# Patient Record
Sex: Female | Born: 1935 | Race: Black or African American | Hispanic: No | Marital: Single | Smoking: Former smoker
Health system: Southern US, Community
[De-identification: ages and names within clinical notes are randomized; demographics above are authoritative.]

## PROBLEM LIST (undated history)

## (undated) DIAGNOSIS — I714 Abdominal aortic aneurysm, without rupture, unspecified: Secondary | ICD-10-CM

## (undated) DIAGNOSIS — I340 Nonrheumatic mitral (valve) insufficiency: Secondary | ICD-10-CM

## (undated) DIAGNOSIS — I10 Essential (primary) hypertension: Secondary | ICD-10-CM

## (undated) DIAGNOSIS — E7800 Pure hypercholesterolemia, unspecified: Secondary | ICD-10-CM

## (undated) DIAGNOSIS — J449 Chronic obstructive pulmonary disease, unspecified: Secondary | ICD-10-CM

## (undated) DIAGNOSIS — Z86718 Personal history of other venous thrombosis and embolism: Secondary | ICD-10-CM

## (undated) DIAGNOSIS — K219 Gastro-esophageal reflux disease without esophagitis: Secondary | ICD-10-CM

## (undated) DIAGNOSIS — IMO0001 Reserved for inherently not codable concepts without codable children: Secondary | ICD-10-CM

## (undated) HISTORY — PX: OVARIAN CYST REMOVAL: SHX89

## (undated) HISTORY — PX: BREAST LUMPECTOMY: SHX2

## (undated) HISTORY — PX: FEMORAL ARTERY STENT: SHX1583

## (undated) HISTORY — PX: TOE AMPUTATION: SHX809

## (undated) HISTORY — PX: CYST EXCISION: SHX5701

## (undated) HISTORY — PX: TONSILLECTOMY: SUR1361

## (undated) HISTORY — PX: OTHER SURGICAL HISTORY: SHX169

---

## 2004-06-15 ENCOUNTER — Ambulatory Visit: Payer: Self-pay | Admitting: *Deleted

## 2004-06-17 ENCOUNTER — Ambulatory Visit: Payer: Self-pay | Admitting: *Deleted

## 2004-07-29 ENCOUNTER — Observation Stay: Payer: Self-pay | Admitting: *Deleted

## 2006-02-07 ENCOUNTER — Ambulatory Visit: Payer: Self-pay | Admitting: Unknown Physician Specialty

## 2006-03-09 ENCOUNTER — Ambulatory Visit: Payer: Self-pay | Admitting: Unknown Physician Specialty

## 2006-05-11 ENCOUNTER — Emergency Department: Payer: Self-pay | Admitting: Emergency Medicine

## 2006-05-16 ENCOUNTER — Ambulatory Visit: Payer: Self-pay | Admitting: Family Medicine

## 2009-04-18 ENCOUNTER — Ambulatory Visit: Payer: Self-pay | Admitting: Vascular Surgery

## 2009-09-30 ENCOUNTER — Ambulatory Visit: Payer: Self-pay | Admitting: Family Medicine

## 2010-07-21 ENCOUNTER — Ambulatory Visit: Payer: Self-pay | Admitting: Vascular Surgery

## 2011-05-20 ENCOUNTER — Ambulatory Visit: Payer: Self-pay | Admitting: Vascular Surgery

## 2011-05-20 LAB — BASIC METABOLIC PANEL
BUN: 10 mg/dL (ref 7–18)
Calcium, Total: 9.1 mg/dL (ref 8.5–10.1)
Co2: 28 mmol/L (ref 21–32)
Creatinine: 0.72 mg/dL (ref 0.60–1.30)
EGFR (Non-African Amer.): >60
Glucose: 84 mg/dL (ref 65–99)
Potassium: 3.2 mmol/L — ABNORMAL LOW (ref 3.5–5.1)
Sodium: 141 mmol/L (ref 136–145)

## 2011-05-21 ENCOUNTER — Emergency Department: Payer: Self-pay | Admitting: Emergency Medicine

## 2011-06-23 ENCOUNTER — Ambulatory Visit: Payer: Self-pay | Admitting: Podiatry

## 2011-06-23 LAB — BASIC METABOLIC PANEL
Anion Gap: 6 — ABNORMAL LOW (ref 7–16)
BUN: 15 mg/dL (ref 7–18)
Calcium, Total: 9.4 mg/dL (ref 8.5–10.1)
Chloride: 109 mmol/L — ABNORMAL HIGH (ref 98–107)
Creatinine: 0.87 mg/dL (ref 0.60–1.30)
EGFR (African American): >60
Glucose: 99 mg/dL (ref 65–99)
Osmolality: 286 (ref 275–301)
Sodium: 143 mmol/L (ref 136–145)

## 2011-06-23 LAB — HEMOGLOBIN: HGB: 11.5 g/dL — ABNORMAL LOW (ref 12.0–16.0)

## 2011-06-25 ENCOUNTER — Ambulatory Visit: Payer: Self-pay | Admitting: Podiatry

## 2011-06-28 LAB — PATHOLOGY REPORT

## 2011-07-03 LAB — WOUND CULTURE

## 2011-10-12 ENCOUNTER — Ambulatory Visit: Payer: Self-pay | Admitting: Vascular Surgery

## 2011-10-12 LAB — BASIC METABOLIC PANEL
Anion Gap: 8 (ref 7–16)
Calcium, Total: 9.6 mg/dL (ref 8.5–10.1)
Chloride: 110 mmol/L — ABNORMAL HIGH (ref 98–107)
Co2: 25 mmol/L (ref 21–32)
Creatinine: 0.8 mg/dL (ref 0.60–1.30)
Sodium: 143 mmol/L (ref 136–145)

## 2013-01-08 ENCOUNTER — Ambulatory Visit: Payer: Self-pay | Admitting: Vascular Surgery

## 2013-01-08 LAB — CREATININE, SERUM
Creatinine: 0.59 mg/dL — ABNORMAL LOW (ref 0.60–1.30)
EGFR (African American): >60
EGFR (Non-African Amer.): >60

## 2014-06-14 NOTE — Op Note (Signed)
PATIENT NAME:  Gabrielle Lyons, Gabrielle Lyons MR#:  [id number] DATE OF BIRTH:  [birth year]  DATE OF PROCEDURE:  01/08/2013  PREOPERATIVE DIAGNOSES:  1.  Peripheral arterial disease with claudication and possible ischemic rest pain, right foot.  2.  Peripheral arterial disease with claudication, left lower extremity.  3.  Status post previous intervention.  4.  Hypertension.  5.  Tobacco dependence.   POSTOPERATIVE DIAGNOSES: 1.  Peripheral arterial disease with claudication and possible ischemic rest pain, right foot.  2.  Peripheral arterial disease with claudication, left lower extremity.  3.  Status post previous intervention.  4.  Hypertension.  5.  Tobacco dependence.   PROCEDURE:  1.  Ultrasound guidance for vascular access to left femoral artery.  2.  Catheter placement to right peroneal artery from left femoral approach.  3.  Aortogram and selective right lower extremity angiogram.  4.  Percutaneous transluminal angioplasty of right tibioperoneal trunk and peroneal artery with 3 mm diameter angioplasty balloon.  5.  StarClose closure device, left femoral artery.   SURGEON: Annice NeedyJason S Dew, M.D.   ANESTHESIA: Local with moderate conscious sedation.   ESTIMATED BLOOD LOSS: Approximately 25 mL.   INDICATION FOR PROCEDURE: This is an individual with severe peripheral vascular, disease we have treated for some time. She has had previous right lower extremity intervention, which improved her pain in the right lower extremity. She now has pain with very minimal activity consistent with short distance claudication and some pain even at night, which may be early ischemic rest pain. For these reasons, she is brought in for angiography. A noninvasive study has shown that the flow through her stent is somewhat sluggish and with distal flow being significantly reduced. She desires to proceed with intervention. The risks and benefits were discussed. Informed consent was obtained.   DESCRIPTION OF PROCEDURE:  The patient is brought to the vascular interventional radiology suite. Groins were shaved and prepped and a sterile surgical field was created. The left femoral head was localized with fluoroscopy. Ultrasound was used to visualize a patent left femoral artery. It was then accessed under direct ultrasound guidance without difficulty with a  Seldinger needle. A J-wire and 5-French sheath were placed. A pigtail catheter was placed into the aorta at the L1-L2 level. AP aortogram was performed. This showed some aneurysmal degeneration of the aorta and iliac vessels without flow-limiting stenosis. I then hooked the aortic bifurcation and advanced to the right femoral head and selective right lower extremity angiogram was then performed. This demonstrated her proximal SFA stent was patent. Her mid to distal SFA stents that were continuous with her stent down to the below-knee popliteal artery were all patent. There were several areas with less than 30% stenosis within the stents however, just below the stent in the below-knee popliteal artery location in the area of the tibioperoneal trunk in her distal popliteal artery, the artery occluded. Her runoff was very poor distally and was mostly through collaterals. The patient was systemically heparinized.   A 6-French Ansell sheath was placed over a Terumo advantage wire. I then used initially a Kumpe catheter, then a CXI catheter and Terumo advantage wire and exchanged for an 0.018 wire. I was able to cross the tibioperoneal trunk and peroneal occlusion and park a wire in the peroneal artery at the ankle. A 2.5 mm diameter angioplasty balloon was inflated from the distal peroneal artery all the way up to the popliteal artery encompassing the areas of occlusions. Following angioplasty, there was markedly improved flow  in the peroneal artery. There was an AV fistula distally, but this did not appear to flow into the foot pain and her distal perfusion was significantly  improved. At this point, I elected to terminate the procedure. The sheath was pulled back to the ipsilateral external iliac artery and oblique arteriogram was performed. StarClose closure device was deployed in the usual fashion with excellent hemostatic result. The patient tolerated the procedure well and was taken to the recovery room in stable condition.   ____________________________ Annice Needy, MD jsd:aw D: 01/08/2013 11:20:45 ET T: 01/08/2013 12:02:27 ET JOB#: [id number]  cc: Annice Needy, MD, <Dictator> Annice Needy MD ELECTRONICALLY SIGNED 01/10/2013 8:42

## 2014-06-16 NOTE — Op Note (Signed)
PATIENT NAME:  Gabrielle Lyons, Gabrielle Lyons MR#:  [id number] DATE OF BIRTH:  [birth year]  DATE OF PROCEDURE:  06/25/2011  PREOPERATIVE DIAGNOSIS: Right third toe gangrene with osteomyelitis.   POSTOPERATIVE DIAGNOSIS: Right third toe gangrene with osteomyelitis.   PROCEDURE: Amputation of metatarsophalangeal joint right third toe.   SURGEON: Refoel Palladino A. Ether GriffinsFowler, DPM.   ANESTHESIA: MAC with local.   HEMOSTASIS: None.  COMPLICATIONS: None.  SPECIMEN: Gangrenous third toe for culture as well as pathology.   OPERATIVE INDICATIONS: This is a 81 year old female who has developed osteomyelitis with gangrene of her right third toe. We have treated her conservatively. She had been evaluated by vascular surgery with lower extremity stenting. At this time, we have discussed surgical intervention and consent has been given.   DESCRIPTION OF PROCEDURE: The patient was brought into the Operating Room and placed on the operating table in the supine position. IV sedation was administered by the anesthesia team. A local block was placed around the third metatarsophalangeal joint. After sterile prep and drape, attention was directed to the third metatarsophalangeal joint where two semi-elliptical incisions were made full thickness down to bone. The toe was then disarticulated sharply and removed from the surgical field in toto. Mild amount of bleeding was noted in the wound. There was no purulence. A wound culture was then taken of the deeper wound area. At this time, the wound was flushed with copious amounts of irrigation and closed with 4-0 Vicryl for the deeper layer and a 5-0 nylon for skin. A 0.5% Marcaine block was placed around all areas and a well-compressive sterile dressing was placed on the right foot. Overall, the patient tolerated the procedure and anesthesia well and was transported from the Operating Room to the     PACU with all vital signs stable and neurovascular status intact. I will see her in the  outpatient clinic in 5 to 7 days.   ____________________________ Argentina DonovanJustin A. Ether GriffinsFowler, DPM jaf:ap D: 06/25/2011 10:13:38 ET T: 06/25/2011 11:25:37 ET JOB#: [id number]  cc: Jill AlexandersJustin A. Ether GriffinsFowler, DPM, <Dictator> Kinley Ferrentino DPM ELECTRONICALLY SIGNED 07/02/2011 10:43

## 2014-11-12 ENCOUNTER — Encounter: Payer: Self-pay | Admitting: *Deleted

## 2014-11-15 NOTE — Discharge Instructions (Signed)

## 2014-11-18 ENCOUNTER — Ambulatory Visit: Payer: Medicare Other | Admitting: Student in an Organized Health Care Education/Training Program

## 2014-11-18 ENCOUNTER — Ambulatory Visit
Admission: RE | Admit: 2014-11-18 | Discharge: 2014-11-18 | Disposition: A | Discharge status: Home / Self Care | Payer: Medicare Other | Source: Ambulatory Visit | Attending: Ophthalmology | Admitting: Ophthalmology

## 2014-11-18 ENCOUNTER — Encounter
Admission: RE | Disposition: A | Discharge status: Home / Self Care | Payer: Self-pay | Source: Ambulatory Visit | Attending: Ophthalmology

## 2014-11-18 ENCOUNTER — Encounter: Payer: Self-pay | Admitting: *Deleted

## 2014-11-18 DIAGNOSIS — Z7982 Long term (current) use of aspirin: Secondary | ICD-10-CM | POA: Insufficient documentation

## 2014-11-18 DIAGNOSIS — Z87891 Personal history of nicotine dependence: Secondary | ICD-10-CM | POA: Insufficient documentation

## 2014-11-18 DIAGNOSIS — J449 Chronic obstructive pulmonary disease, unspecified: Secondary | ICD-10-CM | POA: Diagnosis not present

## 2014-11-18 DIAGNOSIS — Z882 Allergy status to sulfonamides status: Secondary | ICD-10-CM | POA: Insufficient documentation

## 2014-11-18 DIAGNOSIS — I739 Peripheral vascular disease, unspecified: Secondary | ICD-10-CM | POA: Insufficient documentation

## 2014-11-18 DIAGNOSIS — I10 Essential (primary) hypertension: Secondary | ICD-10-CM | POA: Insufficient documentation

## 2014-11-18 DIAGNOSIS — H2512 Age-related nuclear cataract, left eye: Secondary | ICD-10-CM | POA: Insufficient documentation

## 2014-11-18 DIAGNOSIS — K219 Gastro-esophageal reflux disease without esophagitis: Secondary | ICD-10-CM | POA: Insufficient documentation

## 2014-11-18 HISTORY — DX: Gastro-esophageal reflux disease without esophagitis: K21.9

## 2014-11-18 HISTORY — PX: CATARACT EXTRACTION W/PHACO: SHX586

## 2014-11-18 HISTORY — DX: Chronic obstructive pulmonary disease, unspecified: J44.9

## 2014-11-18 HISTORY — DX: Nonrheumatic mitral (valve) insufficiency: I34.0

## 2014-11-18 HISTORY — DX: Essential (primary) hypertension: I10

## 2014-11-18 HISTORY — DX: Personal history of other venous thrombosis and embolism: Z86.718

## 2014-11-18 HISTORY — DX: Pure hypercholesterolemia, unspecified: E78.00

## 2014-11-18 HISTORY — DX: Abdominal aortic aneurysm, without rupture, unspecified: I71.40

## 2014-11-18 HISTORY — DX: Reserved for inherently not codable concepts without codable children: IMO0001

## 2014-11-18 HISTORY — DX: Abdominal aortic aneurysm, without rupture: I71.4

## 2014-11-18 SURGERY — PHACOEMULSIFICATION, CATARACT, WITH IOL INSERTION
Anesthesia: Monitor Anesthesia Care | Laterality: Left | Wound class: Clean

## 2014-11-18 MED ORDER — PROPARACAINE HCL 0.5 % OP SOLN
1.0000 [drp] | Freq: Once | OPHTHALMIC | Status: AC
  Administered 2014-11-18: 1 [drp] via OPHTHALMIC

## 2014-11-18 MED ORDER — ARMC OPHTHALMIC DILATING GEL
1.0000 "application " | OPHTHALMIC | Status: DC | PRN
  Administered 2014-11-18 (×2): 1 via OPHTHALMIC

## 2014-11-18 MED ORDER — NA HYALUR & NA CHOND-NA HYALUR 0.55-0.5 ML IO KIT
PACK | INTRAOCULAR | Status: DC | PRN
  Administered 2014-11-18: 1 via INTRAOCULAR

## 2014-11-18 MED ORDER — LIDOCAINE HCL (PF) 4 % IJ SOLN
INTRAMUSCULAR | Status: DC | PRN
  Administered 2014-11-18: 1 mL via OPHTHALMIC

## 2014-11-18 MED ORDER — TIMOLOL MALEATE 0.5 % OP SOLN
OPHTHALMIC | Status: DC | PRN
  Administered 2014-11-18: 1 [drp]

## 2014-11-18 MED ORDER — CEFUROXIME OPHTHALMIC INJECTION 1 MG/0.1 ML
INJECTION | OPHTHALMIC | Status: DC | PRN
  Administered 2014-11-18: 0.1 mL via INTRACAMERAL

## 2014-11-18 MED ORDER — BRIMONIDINE TARTRATE 0.2 % OP SOLN
OPHTHALMIC | Status: DC | PRN
  Administered 2014-11-18: 1 [drp]

## 2014-11-18 MED ORDER — POVIDONE-IODINE 5 % OP SOLN
1.0000 "application " | OPHTHALMIC | Status: DC | PRN
  Administered 2014-11-18: 1 via OPHTHALMIC

## 2014-11-18 MED ORDER — EPINEPHRINE HCL 1 MG/ML IJ SOLN
INTRAOCULAR | Status: DC | PRN
  Administered 2014-11-18: 114 mL via OPHTHALMIC

## 2014-11-18 MED ORDER — LACTATED RINGERS IV SOLN: INTRAVENOUS | Status: DC

## 2014-11-18 SURGICAL SUPPLY — 29 items
APPLICATOR COTTON TIP 3IN (MISCELLANEOUS) ×3 IMPLANT
CANNULA ANT/CHMB 27GA (MISCELLANEOUS) ×3 IMPLANT
DISSECTOR HYDRO NUCLEUS 50X22 (MISCELLANEOUS) ×3 IMPLANT
GLOVE BIO SURGEON STRL SZ7 (GLOVE) ×3 IMPLANT
GLOVE SURG LX 6.5 MICRO (GLOVE) ×2
GLOVE SURG LX STRL 6.5 MICRO (GLOVE) ×1 IMPLANT
GOWN STRL REUS W/ TWL LRG LVL3 (GOWN DISPOSABLE) ×2 IMPLANT
GOWN STRL REUS W/TWL LRG LVL3 (GOWN DISPOSABLE) ×4
LENS IOL ACRSF IQ PC 20.0 (Intraocular Lens) ×1 IMPLANT
LENS IOL ACRYSOF IQ POST 20.0 (Intraocular Lens) ×3 IMPLANT
MARKER SKIN SURG W/RULER VIO (MISCELLANEOUS) ×3 IMPLANT
NEEDLE FILTER BLUNT 18X 1/2SAF (NEEDLE) ×2
NEEDLE FILTER BLUNT 18X1 1/2 (NEEDLE) ×1 IMPLANT
PACK CATARACT BRASINGTON (MISCELLANEOUS) ×3 IMPLANT
PACK EYE AFTER SURG (MISCELLANEOUS) ×3 IMPLANT
PACK OPTHALMIC (MISCELLANEOUS) ×3 IMPLANT
RING MALYGIN 7.0 (MISCELLANEOUS) IMPLANT
SOL BAL SALT 15ML (MISCELLANEOUS)
SOLUTION BAL SALT 15ML (MISCELLANEOUS) IMPLANT
SUT ETHILON 10-0 CS-B-6CS-B-6 (SUTURE)
SUT VICRYL  9 0 (SUTURE)
SUT VICRYL 9 0 (SUTURE) IMPLANT
SUTURE EHLN 10-0 CS-B-6CS-B-6 (SUTURE) IMPLANT
SYR 3ML LL SCALE MARK (SYRINGE) ×3 IMPLANT
SYR TB 1ML LUER SLIP (SYRINGE) ×6 IMPLANT
WATER STERILE IRR 250ML POUR (IV SOLUTION) ×3 IMPLANT
WATER STERILE IRR 500ML POUR (IV SOLUTION) IMPLANT
WICK EYE OCUCEL (MISCELLANEOUS) IMPLANT
WIPE NON LINTING 3.25X3.25 (MISCELLANEOUS) ×3 IMPLANT

## 2014-11-18 NOTE — Anesthesia Postprocedure Evaluation (Signed)
  Anesthesia Post-op Note  Patient: Gabrielle Lyons  Procedure(s) Performed: Procedure(s): CATARACT EXTRACTION PHACO AND INTRAOCULAR LENS PLACEMENT (IOC) (Left)  Anesthesia type:MAC  Patient location: PACU  Post pain: Pain level controlled  Post assessment: Post-op Vital signs reviewed, Patient's Cardiovascular Status Stable, Respiratory Function Stable, Patent Airway and No signs of Nausea or vomiting  Post vital signs: Reviewed and stable  Last Vitals:  Filed Vitals:   11/18/14 0915  BP: 134/92  Pulse: 49  Temp:   Resp: 15    Level of consciousness: awake, alert  and patient cooperative  Complications: No apparent anesthesia complications

## 2014-11-18 NOTE — Anesthesia Preprocedure Evaluation (Addendum)
Anesthesia Evaluation  Patient identified by MRN, date of birth, ID band  Airway Mallampati: II  TM Distance: >3 FB Neck ROM: Full    Dental   Pulmonary COPD, former smoker,           Cardiovascular hypertension, + Peripheral Vascular Disease       Neuro/Psych    GI/Hepatic GERD  ,  Endo/Other    Renal/GU      Musculoskeletal   Abdominal   Peds  Hematology   Anesthesia Other Findings   Reproductive/Obstetrics                            Anesthesia Physical Anesthesia Plan  ASA: III  Anesthesia Plan: MAC   Post-op Pain Management:    Induction: Intravenous  Airway Management Planned:   Additional Equipment:   Intra-op Plan:   Post-operative Plan:   Informed Consent: I have reviewed the patients History and Physical, chart, labs and discussed the procedure including the risks, benefits and alternatives for the proposed anesthesia with the patient or authorized representative who has indicated his/her understanding and acceptance.     Plan Discussed with: CRNA  Anesthesia Plan Comments:         Anesthesia Quick Evaluation

## 2014-11-18 NOTE — H&P (Signed)
H+P reviewed and is up to date, please see paper chart.  

## 2014-11-18 NOTE — Anesthesia Procedure Notes (Signed)
Procedure Name: MAC Performed by: BUSH, WENDY Pre-anesthesia Checklist: Patient identified, Emergency Drugs available, Suction available, Timeout performed and Patient being monitored Patient Re-evaluated:Patient Re-evaluated prior to inductionOxygen Delivery Method: Nasal cannula Placement Confirmation: positive ETCO2     

## 2014-11-18 NOTE — Op Note (Signed)
Date of Surgery: 11/18/2014  PREOPERATIVE DIAGNOSES: Visually significant nuclear sclerotic cataract, left eye.  POSTOPERATIVE DIAGNOSES: Same  PROCEDURES PERFORMED: Cataract extraction with intraocular lens implant, left eye.  SURGEON: Devin Going, M.D.  ANESTHESIA: MAC and topical  IMPLANTS: AcrySof IQ [id number] +20.0 D   Implant Name Type Inv. Item Serial No. Manufacturer Lot No. LRB No. Used  IMPLANT LENS - [id number] Intraocular Lens IMPLANT LENS [id number] ALCON   Left 1    COMPLICATIONS: None.  DESCRIPTION OF PROCEDURE: Therapeutic options were discussed with the patient preoperatively, including a discussion of risks and benefits of surgery. Informed consent was obtained. An IOL-Master and immersion biometry were used to take the lens measurements, and a dilated fundus exam was performed within 6 months of the surgical date.  The patient was premedicated and brought to the operating room and placed on the operating table in the supine position. After adequate anesthesia, the patient was prepped and draped in the usual sterile ophthalmic fashion. A wire lid speculum was inserted and the microscope was positioned. A Superblade was used to create a paracentesis site at the limbus and a small amount of dilute preservative free lidocaine was instilled into the anterior chamber, followed by dispersive viscoelastic. A clear corneal incision was created temporally using a 2.4 mm keratome blade. Capsulorrhexis was then performed. In situ phacoemulsification was performed.  Cortical material was removed with the irrigation-aspiration unit. Dispersive viscoelastic was instilled to open the capsular bag. A posterior chamber intraocular lens with the specifications above was inserted and positioned. Irrigation-aspiration was used to remove all viscoelastic. Cefuroxime 1cc was into the anterior chamber, and the corneal incision was checked and found to be water tight. The eyelid speculum was  removed.  The operative eye was covered with protective goggles after instilling 1 drop of timolol and brimonidine. The patient tolerated the procedure well. There were no complications.

## 2014-11-18 NOTE — Transfer of Care (Signed)
Immediate Anesthesia Transfer of Care Note  Patient: Gabrielle Lyons  Procedure(s) Performed: Procedure(s): CATARACT EXTRACTION PHACO AND INTRAOCULAR LENS PLACEMENT (IOC) (Left)  Patient Location: PACU  Anesthesia Type: MAC  Level of Consciousness: awake, alert  and patient cooperative  Airway and Oxygen Therapy: Patient Spontanous Breathing and Patient connected to supplemental oxygen  Post-op Assessment: Post-op Vital signs reviewed, Patient's Cardiovascular Status Stable, Respiratory Function Stable, Patent Airway and No signs of Nausea or vomiting  Post-op Vital Signs: Reviewed and stable  Complications: No apparent anesthesia complications

## 2015-01-11 ENCOUNTER — Emergency Department
Admission: EM | Admit: 2015-01-11 | Discharge: 2015-01-11 | Disposition: A | Discharge status: Home / Self Care | Payer: Medicare Other | Attending: Emergency Medicine | Admitting: Emergency Medicine

## 2015-01-11 ENCOUNTER — Encounter: Payer: Self-pay | Admitting: Emergency Medicine

## 2015-01-11 ENCOUNTER — Emergency Department: Payer: Medicare Other

## 2015-01-11 DIAGNOSIS — S199XXA Unspecified injury of neck, initial encounter: Secondary | ICD-10-CM | POA: Diagnosis not present

## 2015-01-11 DIAGNOSIS — W01198A Fall on same level from slipping, tripping and stumbling with subsequent striking against other object, initial encounter: Secondary | ICD-10-CM | POA: Diagnosis not present

## 2015-01-11 DIAGNOSIS — Z87891 Personal history of nicotine dependence: Secondary | ICD-10-CM | POA: Insufficient documentation

## 2015-01-11 DIAGNOSIS — Z791 Long term (current) use of non-steroidal anti-inflammatories (NSAID): Secondary | ICD-10-CM | POA: Insufficient documentation

## 2015-01-11 DIAGNOSIS — Y9289 Other specified places as the place of occurrence of the external cause: Secondary | ICD-10-CM | POA: Diagnosis not present

## 2015-01-11 DIAGNOSIS — Z7982 Long term (current) use of aspirin: Secondary | ICD-10-CM | POA: Diagnosis not present

## 2015-01-11 DIAGNOSIS — I10 Essential (primary) hypertension: Secondary | ICD-10-CM | POA: Insufficient documentation

## 2015-01-11 DIAGNOSIS — Y998 Other external cause status: Secondary | ICD-10-CM | POA: Diagnosis not present

## 2015-01-11 DIAGNOSIS — Y9389 Activity, other specified: Secondary | ICD-10-CM | POA: Insufficient documentation

## 2015-01-11 DIAGNOSIS — S0990XA Unspecified injury of head, initial encounter: Secondary | ICD-10-CM

## 2015-01-11 DIAGNOSIS — Z79899 Other long term (current) drug therapy: Secondary | ICD-10-CM | POA: Diagnosis not present

## 2015-01-11 DIAGNOSIS — R42 Dizziness and giddiness: Secondary | ICD-10-CM

## 2015-01-11 LAB — URINALYSIS COMPLETE WITH MICROSCOPIC (ARMC ONLY)
BACTERIA UA: NONE SEEN
Bilirubin Urine: NEGATIVE
Glucose, UA: NEGATIVE mg/dL
Ketones, ur: NEGATIVE mg/dL
Leukocytes, UA: NEGATIVE
NITRITE: NEGATIVE
PROTEIN: NEGATIVE mg/dL
SPECIFIC GRAVITY, URINE: 1.017 (ref 1.005–1.030)
pH: 6 (ref 5.0–8.0)

## 2015-01-11 LAB — BASIC METABOLIC PANEL
Anion gap: 10 (ref 5–15)
BUN: 22 mg/dL — AB (ref 6–20)
CHLORIDE: 103 mmol/L (ref 101–111)
CO2: 29 mmol/L (ref 22–32)
Calcium: 9.9 mg/dL (ref 8.9–10.3)
Creatinine, Ser: 0.89 mg/dL (ref 0.44–1.00)
GFR calc Af Amer: >60 mL/min (ref >60)
GFR calc non Af Amer: >60 mL/min (ref >60)
Glucose, Bld: 92 mg/dL (ref 65–99)
POTASSIUM: 3.5 mmol/L (ref 3.5–5.1)
SODIUM: 142 mmol/L (ref 135–145)

## 2015-01-11 LAB — CBC
HEMATOCRIT: 36.1 % (ref 35.0–47.0)
HEMOGLOBIN: 11.9 g/dL — AB (ref 12.0–16.0)
MCH: 28.7 pg (ref 26.0–34.0)
MCHC: 33 g/dL (ref 32.0–36.0)
MCV: 86.8 fL (ref 80.0–100.0)
Platelets: 157 10*3/uL (ref 150–440)
RBC: 4.17 MIL/uL (ref 3.80–5.20)
RDW: 15.5 % — ABNORMAL HIGH (ref 11.5–14.5)
WBC: 8 10*3/uL (ref 3.6–11.0)

## 2015-01-11 NOTE — ED Notes (Signed)
Pt to ed with c/o dizziness since hitting head after fall on Monday.  Pt alert and oriented at triage.

## 2015-01-11 NOTE — ED Notes (Signed)
Patient states that she fell on Monday not exactly sure how but thinks she may have tripped. Patient denies LOC. Patient reports increased dizziness since the fall, denies N/V and headache.

## 2015-01-11 NOTE — ED Provider Notes (Signed)
[facility] Emergency Department Provider Note REMINDER - THIS NOTE IS NOT A FINAL MEDICAL RECORD UNTIL IT IS SIGNED. UNTIL THEN, THE CONTENT BELOW MAY REFLECT INFORMATION FROM A DOCUMENTATION TEMPLATE, NOT THE ACTUAL PATIENT VISIT. ____________________________________________  Time seen: Approximately 11:52 AM  I have reviewed the triage vital signs and the nursing notes.   HISTORY  Chief Complaint Head Injury    HPI Gabrielle Lyons is a 81 y.o. female presents for evaluation of some lightheadedness after a fall on Monday.  Patient and the family reports that Monday she was standing and she lost her balance, which is not unusual but she fell backwards and hit her head on the floor. There was no loss of consciousness, and she got up afterwards without injury but since that time she has been feeling slightly lightheaded at times. No nausea or vomiting, no headache. No pain in her eyes, she reports that her upper neck feels a little bit achy and she is tender across the back of the head. No chest pain or trouble breathing or other concerns.  Describes a mild achiness across the back of the scalp. She has a history of an aneurysm, does not have any abdominal discomfort or pain. No back pain.   Past Medical History  Diagnosis Date  . Hypertension   . Mitral valve insufficiency   . COPD (chronic obstructive pulmonary disease) (HCC)   . Shortness of breath dyspnea     with exertion  . GERD (gastroesophageal reflux disease)   . Hypercholesteremia   . Abdominal aneurysm (HCC)     also, "poor circulation" - legs  . H/O blood clots     legs    There are no active problems to display for this patient.   Past Surgical History  Procedure Laterality Date  . Toe amputation Right   . Femoral artery stent Right   . Tonsillectomy    . Ovarian cyst removal    . Breast lumpectomy Left   . Cyst excision      back  . Cataract extraction w/phaco Left 11/18/2014   Procedure: CATARACT EXTRACTION PHACO AND INTRAOCULAR LENS PLACEMENT (IOC);  Surgeon: Sherald HessAnita Prakash Vin-Parikh, MD;  [facility];  Service: Ophthalmology;  Laterality: Left;    Current Outpatient Rx  Name  Route  Sig  Dispense  Refill  . amLODipine (NORVASC) 10 MG tablet   Oral   Take 10 mg by mouth daily. AM         . aspirin 81 MG tablet   Oral   Take 81 mg by mouth daily. AM         . calcium carbonate (TUMS - DOSED IN MG ELEMENTAL CALCIUM) 500 MG chewable tablet   Oral   Chew 1 tablet by mouth daily as needed for indigestion or heartburn.         . hydrochlorothiazide (HYDRODIURIL) 25 MG tablet   Oral   Take 25 mg by mouth daily. PM         . lisinopril (PRINIVIL,ZESTRIL) 10 MG tablet   Oral   Take 10 mg by mouth 2 (two) times daily.         . naproxen sodium (ANAPROX) 220 MG tablet   Oral   Take 220 mg by mouth daily.         . pravastatin (PRAVACHOL) 10 MG tablet   Oral   Take 10 mg by mouth daily. PM         .  Vitamin D, Ergocalciferol, (DRISDOL) 50000 UNITS CAPS capsule   Oral   Take 50,000 Units by mouth every 7 (seven) days.           Allergies Sulfa antibiotics  History reviewed. No pertinent family history.  Social History Social History  Substance Use Topics  . Smoking status: Former Smoker    Quit date: 10/23/2013  . Smokeless tobacco: Current User    Types: Snuff  . Alcohol Use: No    Review of Systems Constitutional: No fever/chills Eyes: No visual changes. Does report revision is been slightly different since she had her cataracts removed and she still adjusting to this but no new concerns. ENT: No sore throat. Cardiovascular: Denies chest pain. Respiratory: Denies shortness of breath. Gastrointestinal: No abdominal pain.  No nausea, no vomiting.  No diarrhea.  No constipation. Genitourinary: Negative for dysuria. Musculoskeletal: Negative for back pain. Skin: Negative for rash. Neurological: Negative  for headaches, focal weakness or numbness.  10-point ROS otherwise negative.  ____________________________________________   PHYSICAL EXAM:  VITAL SIGNS: ED Triage Vitals  Enc Vitals Group     BP 01/11/15 0933 113/80 mmHg     Pulse Rate 01/11/15 0933 100     Resp 01/11/15 0933 18     Temp 01/11/15 0933 98.2 F (36.8 C)     Temp Source 01/11/15 0933 Oral     SpO2 01/11/15 0933 96 %     Weight 01/11/15 0933 130 lb (58.968 kg)     Height 01/11/15 0933  (1.676 m)     Head Cir --      Peak Flow --      Pain Score 01/11/15 0931 3     Pain Loc --      Pain Edu? --      Excl. in GC? --    Constitutional: Alert and oriented. Well appearing and in no acute distress. Eyes: Conjunctivae are normal. PERRL. EOMI. Head: Atraumatic. Nose: No congestion/rhinnorhea. Mouth/Throat: Mucous membranes are moist.  Oropharynx non-erythematous. Neck: No stridor.  No midline cervical tenderness but she does have some mild tenderness the base of the occiput Cardiovascular: Normal rate, regular rhythm. Grossly normal heart sounds.  Good peripheral circulation. Respiratory: Normal respiratory effort.  No retractions. Lungs CTAB. Gastrointestinal: Soft and nontender. No distention. No abdominal bruits. No CVA tenderness. Musculoskeletal: No lower extremity tenderness nor edema.  No joint effusions. Neurologic:  Normal speech and language. No gross focal neurologic deficits are appreciated.  Skin:  Skin is warm, dry and intact. No rash noted. Psychiatric: Mood and affect are normal. Speech and behavior are normal.  ____________________________________________   LABS (all labs ordered are listed, but only abnormal results are displayed)  Labs Reviewed  CBC - Abnormal; Notable for the following:    Hemoglobin 11.9 (*)    RDW 15.5 (*)    All other components within normal limits  BASIC METABOLIC PANEL - Abnormal; Notable for the following:    BUN 22 (*)    All other components within normal  limits  URINALYSIS COMPLETEWITH MICROSCOPIC ([facility] ONLY) - Abnormal; Notable for the following:    Color, Urine YELLOW (*)    APPearance CLEAR (*)    Hgb urine dipstick 2+ (*)    Squamous Epithelial / LPF 0-5 (*)    All other components within normal limits   ____________________________________________  EKG  Reviewed and interpreted by me EKG time 10:45 AM Normal sinus rhythm, heart rate 60 Right bundle-branch block morphology noted with expected T  wave inversions in V1 through V3, there is a slight T-wave inversion noted in V4 dissociative RSR prime configuration likely secondary to bundle-branch block Mobile T-wave inversions also noted in 3 and aVF, no ST elevation ____________________________________________  RADIOLOGY  IMPRESSION: No acute intracranial abnormality.  Evidence for chronic small vessel ischemic changes.  Multilevel degenerative disease in the cervical spine without acute bone abnormality.  2.9 cm right thyroid nodule. Recommend a nonemergent thyroid ultrasound for further characterization. ____________________________________________   PROCEDURES  Procedure(s) performed: None  Critical Care performed: No  ____________________________________________   INITIAL IMPRESSION / ASSESSMENT AND PLAN / ED COURSE  Pertinent labs & imaging results that were available during my care of the patient were reviewed by me and considered in my medical decision making (see chart for details).  Patient presents after a fall she isn't experiencing some slight lightheadedness afterwards. She is not any notable anticoagulants, and she did not lose consciousness. Her exam and neurologic exam are very reassuring. She does endorse a slight feeling of lightheadedness for the last week. Her EKG does show a right bundle-branch block and some minimal but noted T-wave inversions in inferior distribution, however she is nontender states she chest pain there is no evidence of acute  ST elevation.  We'll check blood count given the patient's symptomatology and basic metabolic panel. Also given her age and elderly status, we will evaluate with urinalysis. These are negative, and the patient continues to only have just mild lightheadedness with intact neurologic exam I have advised discharge and follow-up with her primary care whom works at [facility]. Patient and her family are very agreeable with this plan. She is hemodynamically stable and in no distress.  ----------------------------------------- 12:14 PM on 01/11/2015 -----------------------------------------  Patient sitting up in bed, conversing with family in no distress. Denies any concerns. ____________________________________________   FINAL CLINICAL IMPRESSION(S) / ED DIAGNOSES  Final diagnoses:  Lightheadedness  Closed head injury, initial encounter   Thyroid nodule Closed head injury    Sharyn Creamer, MD 01/11/15 1243

## 2015-01-11 NOTE — Discharge Instructions (Signed)
Dizziness  In addition you have a small nodule noted on your thyroid, please make sure you follow up with your primary care doctor for further evaluation regarding this within a month.  Dizziness is a common problem. It is a feeling of unsteadiness or light-headedness. You may feel like you are about to faint. Dizziness can lead to injury if you stumble or fall. Anyone can become dizzy, but dizziness is more common in older adults. This condition can be caused by a number of things, including medicines, dehydration, or illness. HOME CARE INSTRUCTIONS Taking these steps may help with your condition: Eating and Drinking  Drink enough fluid to keep your urine clear or pale yellow. This helps to keep you from becoming dehydrated. Try to drink more clear fluids, such as water.  Do not drink alcohol.  Limit your caffeine intake if directed by your health care provider.  Limit your salt intake if directed by your health care provider. Activity  Avoid making quick movements.  Rise slowly from chairs and steady yourself until you feel okay.  In the morning, first sit up on the side of the bed. When you feel okay, stand slowly while you hold onto something until you know that your balance is fine.  Move your legs often if you need to stand in one place for a long time. Tighten and relax your muscles in your legs while you are standing.  Do not drive or operate heavy machinery if you feel dizzy.  Avoid bending down if you feel dizzy. Place items in your home so that they are easy for you to reach without leaning over. Lifestyle  Do not use any tobacco products, including cigarettes, chewing tobacco, or electronic cigarettes. If you need help quitting, ask your health care provider.  Try to reduce your stress level, such as with yoga or meditation. Talk with your health care provider if you need help. General Instructions  Watch your dizziness for any changes.  Take medicines only as  directed by your health care provider. Talk with your health care provider if you think that your dizziness is caused by a medicine that you are taking.  Tell a friend or a family member that you are feeling dizzy. If he or she notices any changes in your behavior, have this person call your health care provider.  Keep all follow-up visits as directed by your health care provider. This is important. SEEK MEDICAL CARE IF:  Your dizziness does not go away.  Your dizziness or light-headedness gets worse.  You feel nauseous.  You have reduced hearing.  You have new symptoms.  You are unsteady on your feet or you feel like the room is spinning. SEEK IMMEDIATE MEDICAL CARE IF:  You vomit or have diarrhea and are unable to eat or drink anything.  You have problems talking, walking, swallowing, or using your arms, hands, or legs.  You feel generally weak.  You are not thinking clearly or you have trouble forming sentences. It may take a friend or family member to notice this.  You have chest pain, abdominal pain, shortness of breath, or sweating.  Your vision changes.  You notice any bleeding.  You have a headache.  You have neck pain or a stiff neck.  You have a fever.   This information is not intended to replace advice given to you by your health care provider. Make sure you discuss any questions you have with your health care provider.   Document Released: 08/04/2000  Document Revised: 06/25/2014 Document Reviewed: 02/04/2014 Elsevier Interactive Patient Education [employer]

## 2015-05-09 ENCOUNTER — Other Ambulatory Visit
Admission: RE | Admit: 2015-05-09 | Discharge: 2015-05-09 | Disposition: A | Discharge status: Home / Self Care | Payer: Medicare Other | Source: Ambulatory Visit | Attending: Vascular Surgery | Admitting: Vascular Surgery

## 2015-05-09 DIAGNOSIS — I70229 Atherosclerosis of native arteries of extremities with rest pain, unspecified extremity: Secondary | ICD-10-CM | POA: Insufficient documentation

## 2015-05-09 LAB — CREATININE, SERUM
Creatinine, Ser: 1.01 mg/dL — ABNORMAL HIGH (ref 0.44–1.00)
GFR calc Af Amer: 60 mL/min — ABNORMAL LOW (ref >60)
GFR calc non Af Amer: 52 mL/min — ABNORMAL LOW (ref >60)

## 2015-05-09 LAB — BUN: BUN: 34 mg/dL — AB (ref 6–20)

## 2015-05-12 ENCOUNTER — Encounter
Admission: RE | Disposition: A | Discharge status: Home / Self Care | Payer: Self-pay | Source: Ambulatory Visit | Attending: Vascular Surgery

## 2015-05-12 ENCOUNTER — Ambulatory Visit
Admission: RE | Admit: 2015-05-12 | Discharge: 2015-05-12 | Disposition: A | Discharge status: Home / Self Care | Payer: Medicare Other | Source: Ambulatory Visit | Attending: Vascular Surgery | Admitting: Vascular Surgery

## 2015-05-12 DIAGNOSIS — F17200 Nicotine dependence, unspecified, uncomplicated: Secondary | ICD-10-CM | POA: Insufficient documentation

## 2015-05-12 DIAGNOSIS — I10 Essential (primary) hypertension: Secondary | ICD-10-CM | POA: Diagnosis not present

## 2015-05-12 DIAGNOSIS — Z7982 Long term (current) use of aspirin: Secondary | ICD-10-CM | POA: Insufficient documentation

## 2015-05-12 DIAGNOSIS — Z79899 Other long term (current) drug therapy: Secondary | ICD-10-CM | POA: Insufficient documentation

## 2015-05-12 DIAGNOSIS — Z882 Allergy status to sulfonamides status: Secondary | ICD-10-CM | POA: Insufficient documentation

## 2015-05-12 DIAGNOSIS — E785 Hyperlipidemia, unspecified: Secondary | ICD-10-CM | POA: Insufficient documentation

## 2015-05-12 DIAGNOSIS — I70221 Atherosclerosis of native arteries of extremities with rest pain, right leg: Secondary | ICD-10-CM | POA: Diagnosis not present

## 2015-05-12 DIAGNOSIS — Z791 Long term (current) use of non-steroidal anti-inflammatories (NSAID): Secondary | ICD-10-CM | POA: Insufficient documentation

## 2015-05-12 DIAGNOSIS — I739 Peripheral vascular disease, unspecified: Secondary | ICD-10-CM | POA: Insufficient documentation

## 2015-05-12 HISTORY — PX: PERIPHERAL VASCULAR CATHETERIZATION: SHX172C

## 2015-05-12 LAB — CREATININE, SERUM
CREATININE: 0.95 mg/dL (ref 0.44–1.00)
GFR, EST NON AFRICAN AMERICAN: 55 mL/min — AB (ref >60)

## 2015-05-12 LAB — BUN: BUN: 23 mg/dL — AB (ref 6–20)

## 2015-05-12 SURGERY — LOWER EXTREMITY ANGIOGRAPHY
Anesthesia: Moderate Sedation | Laterality: Right

## 2015-05-12 MED ORDER — LIDOCAINE-EPINEPHRINE (PF) 1 %-1:200000 IJ SOLN
INTRAMUSCULAR | Status: DC | PRN
  Administered 2015-05-12: 10 mL via INTRADERMAL

## 2015-05-12 MED ORDER — CLOPIDOGREL BISULFATE 75 MG PO TABS
75.0000 mg | ORAL_TABLET | Freq: Once | ORAL | Status: AC
  Administered 2015-05-12: 75 mg via ORAL

## 2015-05-12 MED ORDER — METOPROLOL TARTRATE 1 MG/ML IV SOLN: 2.0000 mg | INTRAVENOUS | Status: DC | PRN

## 2015-05-12 MED ORDER — HEPARIN SODIUM (PORCINE) 1000 UNIT/ML IJ SOLN
INTRAMUSCULAR | Status: AC
  Filled 2015-05-12: qty 1

## 2015-05-12 MED ORDER — HYDRALAZINE HCL 20 MG/ML IJ SOLN: 5.0000 mg | INTRAMUSCULAR | Status: DC | PRN

## 2015-05-12 MED ORDER — HEPARIN SODIUM (PORCINE) 1000 UNIT/ML IJ SOLN
INTRAMUSCULAR | Status: DC | PRN
  Administered 2015-05-12: 5000 [IU] via INTRAVENOUS

## 2015-05-12 MED ORDER — FENTANYL CITRATE (PF) 100 MCG/2ML IJ SOLN
INTRAMUSCULAR | Status: DC | PRN
  Administered 2015-05-12: 25 ug via INTRAVENOUS
  Administered 2015-05-12: 50 ug via INTRAVENOUS
  Administered 2015-05-12: 25 ug via INTRAVENOUS

## 2015-05-12 MED ORDER — HYDROCODONE-ACETAMINOPHEN 5-325 MG PO TABS: 1.0000 | ORAL_TABLET | Freq: Four times a day (QID) | ORAL | Status: DC | PRN

## 2015-05-12 MED ORDER — OXYCODONE-ACETAMINOPHEN 5-325 MG PO TABS: 1.0000 | ORAL_TABLET | ORAL | Status: DC | PRN

## 2015-05-12 MED ORDER — GUAIFENESIN-DM 100-10 MG/5ML PO SYRP: 15.0000 mL | ORAL_SOLUTION | ORAL | Status: DC | PRN

## 2015-05-12 MED ORDER — LABETALOL HCL 5 MG/ML IV SOLN: 10.0000 mg | INTRAVENOUS | Status: DC | PRN

## 2015-05-12 MED ORDER — CLOPIDOGREL BISULFATE 75 MG PO TABS
ORAL_TABLET | ORAL | Status: AC
  Filled 2015-05-12: qty 1

## 2015-05-12 MED ORDER — PHENOL 1.4 % MT LIQD: 1.0000 | OROMUCOSAL | Status: DC | PRN

## 2015-05-12 MED ORDER — MIDAZOLAM HCL 5 MG/5ML IJ SOLN
INTRAMUSCULAR | Status: AC
  Filled 2015-05-12: qty 5

## 2015-05-12 MED ORDER — HYDROMORPHONE HCL 1 MG/ML IJ SOLN: 0.5000 mg | INTRAMUSCULAR | Status: DC | PRN

## 2015-05-12 MED ORDER — ONDANSETRON HCL 4 MG/2ML IJ SOLN: 4.0000 mg | Freq: Four times a day (QID) | INTRAMUSCULAR | Status: DC | PRN

## 2015-05-12 MED ORDER — FENTANYL CITRATE (PF) 100 MCG/2ML IJ SOLN
INTRAMUSCULAR | Status: AC
  Filled 2015-05-12: qty 2

## 2015-05-12 MED ORDER — HEPARIN (PORCINE) IN NACL 2-0.9 UNIT/ML-% IJ SOLN
INTRAMUSCULAR | Status: AC
  Filled 2015-05-12: qty 1000

## 2015-05-12 MED ORDER — ACETAMINOPHEN 325 MG RE SUPP: 325.0000 mg | RECTAL | Status: DC | PRN

## 2015-05-12 MED ORDER — SODIUM CHLORIDE 0.9 % IV SOLN
INTRAVENOUS | Status: DC
  Administered 2015-05-12 (×2): via INTRAVENOUS

## 2015-05-12 MED ORDER — LIDOCAINE-EPINEPHRINE (PF) 1 %-1:200000 IJ SOLN
INTRAMUSCULAR | Status: AC
  Filled 2015-05-12: qty 30

## 2015-05-12 MED ORDER — IOHEXOL 300 MG/ML  SOLN
INTRAMUSCULAR | Status: DC | PRN
Start: 2015-05-12 — End: 2015-05-12
  Administered 2015-05-12: 70 mL via INTRA_ARTERIAL

## 2015-05-12 MED ORDER — CLOPIDOGREL BISULFATE 75 MG PO TABS: 75.0000 mg | ORAL_TABLET | Freq: Every day | ORAL | Status: DC

## 2015-05-12 MED ORDER — MIDAZOLAM HCL 2 MG/2ML IJ SOLN
INTRAMUSCULAR | Status: DC | PRN
  Administered 2015-05-12: 0.5 mg via INTRAVENOUS
  Administered 2015-05-12: 1.5 mg via INTRAVENOUS
  Administered 2015-05-12: 2 mg via INTRAVENOUS

## 2015-05-12 MED ORDER — ACETAMINOPHEN 325 MG PO TABS: 325.0000 mg | ORAL_TABLET | ORAL | Status: DC | PRN

## 2015-05-12 SURGICAL SUPPLY — 22 items
BALLN LUTONIX DCB 5X80X130 (BALLOONS) ×4
BALLN ULTRVRSE 4X300X150 (BALLOONS) ×4
BALLOON LUTONIX DCB 5X80X130 (BALLOONS) ×2 IMPLANT
BALLOON ULTRVRSE 4X300X150 (BALLOONS) ×2 IMPLANT
CATH CXI SUPP ANG 4FR 135 (MICROCATHETER) ×2 IMPLANT
CATH CXI SUPP ANG 4FR 135CM (MICROCATHETER) ×4
CATH PIG 70CM (CATHETERS) ×4 IMPLANT
CATH SEEKER .035X150CM (CATHETERS) ×4 IMPLANT
CATH VERT 100CM (CATHETERS) ×4 IMPLANT
DEVICE PRESTO INFLATION (MISCELLANEOUS) ×4 IMPLANT
DEVICE STARCLOSE SE CLOSURE (Vascular Products) ×4 IMPLANT
DEVICE TORQUE (MISCELLANEOUS) ×4 IMPLANT
GLIDEWIRE ADV .035X260CM (WIRE) ×4 IMPLANT
PACK ANGIOGRAPHY (CUSTOM PROCEDURE TRAY) ×4 IMPLANT
SHEATH ANL2 6FRX45 HC (SHEATH) ×4 IMPLANT
SHEATH BRITE TIP 4FRX11 (SHEATH) ×4 IMPLANT
SHEATH BRITE TIP 5FRX11 (SHEATH) ×4 IMPLANT
SHEATH RAABE 6FRX70 (SHEATH) ×4 IMPLANT
SYR MEDRAD MARK V 150ML (SYRINGE) ×4 IMPLANT
TUBING CONTRAST HIGH PRESS 72 (TUBING) ×4 IMPLANT
WIRE G V18X300CM (WIRE) ×8 IMPLANT
WIRE J 3MM .035X145CM (WIRE) ×4 IMPLANT

## 2015-05-12 NOTE — Discharge Instructions (Signed)
Angiogram, Care After °Refer to this sheet in the next few weeks. These instructions provide you with information about caring for yourself after your procedure. Your health care provider may also give you more specific instructions. Your treatment has been planned according to current medical practices, but problems sometimes occur. Call your health care provider if you have any problems or questions after your procedure. °WHAT TO EXPECT AFTER THE PROCEDURE °After your procedure, it is typical to have the following: °· Bruising at the catheter insertion site that usually fades within 1-2 weeks. °· Blood collecting in the tissue (hematoma) that may be painful to the touch. It should usually decrease in size and tenderness within 1-2 weeks. °HOME CARE INSTRUCTIONS °· Take medicines only as directed by your health care provider. °· You may shower 24-48 hours after the procedure or as directed by your health care provider. Remove the bandage (dressing) and gently wash the site with plain soap and water. Pat the area dry with a clean towel. Do not rub the site, because this may cause bleeding. °· Do not take baths, swim, or use a hot tub until your health care provider approves. °· Check your insertion site every day for redness, swelling, or drainage. °· Do not apply powder or lotion to the site. °· Do not lift over 10 lb (4.5 kg) for 5 days after your procedure or as directed by your health care provider. °· Ask your health care provider when it is okay to: °¨ Return to work or school. °¨ Resume usual physical activities or sports. °¨ Resume sexual activity. °· Do not drive home if you are discharged the same day as the procedure. Have someone else drive you. °· You may drive 24 hours after the procedure unless otherwise instructed by your health care provider. °· Do not operate machinery or power tools for 24 hours after the procedure or as directed by your health care provider. °· If your procedure was done as an  outpatient procedure, which means that you went home the same day as your procedure, a responsible adult should be with you for the first 24 hours after you arrive home. °· Keep all follow-up visits as directed by your health care provider. This is important. °SEEK MEDICAL CARE IF: °· You have a fever. °· You have chills. °· You have increased bleeding from the catheter insertion site. Hold pressure on the site. °SEEK IMMEDIATE MEDICAL CARE IF: °· You have unusual pain at the catheter insertion site. °· You have redness, warmth, or swelling at the catheter insertion site. °· You have drainage (other than a small amount of blood on the dressing) from the catheter insertion site. °· The catheter insertion site is bleeding, and the bleeding does not stop after 30 minutes of holding steady pressure on the site. °· The area near or just beyond the catheter insertion site becomes pale, cool, tingly, or numb. °  °This information is not intended to replace advice given to you by your health care provider. Make sure you discuss any questions you have with your health care provider. °  °Document Released: 08/27/2004 Document Revised: 03/01/2014 Document Reviewed: 07/12/2012 °Elsevier Interactive Patient Education ©2016 [employer] Inc[employer] ° °

## 2015-05-12 NOTE — Op Note (Signed)
[facility] Percutaneous Study/Intervention Procedural Note   Date of Surgery: 05/12/2015  Surgeon(s):Alianny Toelle   Assistants:none  Pre-operative Diagnosis: PAD with rest pain right lower extremity  Post-operative diagnosis: Same  Procedure(s) Performed: 1. Ultrasound guidance for vascular access left femoral artery 2. Catheter placement into right tibioperoneal trunk from left femoral approach 3. Aortogram and selective right lower extremity angiogram 4. Percutaneous transluminal angioplasty of right proximal SFA with 5 mm diameter by 8 cm length Lutonix drug-coated angioplasty balloon 5. Percutaneous transluminal angioplasty of the right distal SFA, popliteal artery, and tibioperoneal trunk with 4 mm diameter by 30 cm length angioplasty balloon  6. StarClose closure device left femoral artery  EBL: 25 cc  Contrast: 70 cc  Fluro Time: 19 minutes  Moderate Conscious Sedation Time: approximately 60 minutes using 4 mg of Versed and 100 mcg of Fentanyl  Indications: Patient is a 82 y.o.female with rest pain in the right lower extremity after multiple previous interventions. The patient has noninvasive study showing SFA and popliteal occlusion with poor runoff distally and markedly reduced ABIs. The patient is brought in for angiography for further evaluation and potential treatment. Risks and benefits are discussed and informed consent is obtained  Procedure: The patient was identified and appropriate procedural time out was performed. The patient was then placed supine on the table and prepped and draped in the usual sterile fashion.Moderate conscious sedation was administered during a face to face encounter with the patient throughout the procedure with my supervision of the RN administering medicines and monitoring the patient's vital signs, pulse oximetry, telemetry and mental  status throughout from the start of the procedure until the patient was taken to the recovery room. Ultrasound was used to evaluate the left common femoral artery. It was patent . A digital ultrasound image was acquired. A Seldinger needle was used to access the left common femoral artery under direct ultrasound guidance and a permanent image was performed. A 0.035 J wire was advanced without resistance and a 5Fr sheath was placed. Pigtail catheter was placed into the aorta and an AP aortogram was performed. This demonstrated that the aorta and iliac arteries are ectatic. Renal arteries did not appear to have obvious stenosis. . I then crossed the aortic bifurcation and advanced to the right femoral head. Due to poor circulation a catheter was advanced into the proximal SFA. Selective right lower extremity angiogram was then performed. This demonstrated moderate stenosis in the proximal SFA with a previous stent at about 60%, mid to distal SFA occlusion with continued occlusion through the popliteal artery and tibioperoneal trunk and no obvious reconstitution of any tibial vessels distally. The patient was systemically heparinized and a 6 [place] Ansell sheath was then placed over the [employer] wire. I then used a Kumpe catheter and the advantage wire to navigate into the popliteal artery. The sheath Bowing back into the aorta and bouncing off of the proximal SFA in-stent stenosis. I elected to treat this lesion initially with a 5 mm diameter by 8 cm length Lutonix drug-coated angioplasty balloon. This was inflated to 12 atm for 1 minute. Following this, no obvious residual stenosis of more than 20% was identified. Nonetheless, this still became an area were catheters would hang up and the sheath would go back into the aorta and so I would go ahead and exchanged for a 6 French 70 cm catheter and parked this in the mid SFA beyond this stent. Multiple wires and catheters were then used to get into  the  popliteal and tibioperoneal trunk into the occlusion. I confirmed flow in the tibioperoneal trunk but saw no good runoff distally even with a selective image the slow down. I exchanged for a 0.018 wire and took a 4 mm diameter by 30 cm length angioplasty balloon to treat the tibioperoneal trunk occlusion, popliteal occlusion, and the distal SFA in the previous stent that was occluded. This was inflated to 10 atm for 1 minute. Imaging through the sheath still showed no significant distal flow with residual stenosis seen in the popliteal and TP trunk. Multiple passes were made with a 0.018V 18 wire as well as a 0.035 advantage wire to try to gain access to the peroneal artery or posterior tibial artery without success and with several areas of extravasation created. When we were coming up on 20 minutes of fluoroscopy time and it was clear this was going to be an unsuccessful in diameter despite multiple different catheters including angled CXI catheter, straight usher catheter, Kumpe catheters, and a variety of wires I felt that there was very little I can do with no good distal runoff to aim for as a distal target. I elected to terminate the procedure. The sheath was removed and StarClose closure device was deployed in the left femoral artery with excellent hemostatic result. The patient was taken to the recovery room in stable condition having tolerated the procedure well.  Findings:  Aortogram: Aorta and iliac arteries are ectatic. Renal arteries did not appear to have obvious stenosis. Right Lower Extremity: moderate stenosis in the proximal SFA with a previous stent at about 60%, mid to distal SFA occlusion with continued occlusion through the popliteal artery and tibioperoneal trunk and no obvious reconstitution of any tibial vessels distally.   Disposition: Patient was taken to the recovery room in stable condition having tolerated the procedure well.  Complications:  None  Skilynn Durney 05/12/2015 12:40 PM

## 2015-05-12 NOTE — H&P (Signed)
  [facility] History & Physical Update  The patient was interviewed and re-examined.  The patient's previous History and Physical has been reviewed and is unchanged.  There is no change in the plan of care. We plan to proceed with the scheduled procedure.  DEW,JASON, MD  05/12/2015, 10:18 AM

## 2015-05-13 ENCOUNTER — Encounter: Payer: Self-pay | Admitting: Vascular Surgery

## 2015-06-27 ENCOUNTER — Other Ambulatory Visit: Payer: Self-pay | Admitting: Vascular Surgery

## 2015-06-27 ENCOUNTER — Other Ambulatory Visit: Payer: Self-pay | Admitting: Family Medicine

## 2015-07-04 ENCOUNTER — Other Ambulatory Visit
Admission: RE | Admit: 2015-07-04 | Discharge: 2015-07-04 | Disposition: A | Discharge status: Home / Self Care | Payer: Medicare Other | Source: Ambulatory Visit | Attending: Vascular Surgery | Admitting: Vascular Surgery

## 2015-07-04 DIAGNOSIS — I739 Peripheral vascular disease, unspecified: Secondary | ICD-10-CM | POA: Diagnosis present

## 2015-07-04 LAB — BUN: BUN: 15 mg/dL (ref 6–20)

## 2015-07-04 LAB — CREATININE, SERUM
Creatinine, Ser: 0.78 mg/dL (ref 0.44–1.00)
GFR calc Af Amer: >60 mL/min (ref >60)

## 2015-07-07 ENCOUNTER — Ambulatory Visit
Admission: RE | Admit: 2015-07-07 | Discharge: 2015-07-07 | Disposition: A | Discharge status: Home / Self Care | Payer: Medicare Other | Source: Ambulatory Visit | Attending: Vascular Surgery | Admitting: Vascular Surgery

## 2015-07-07 ENCOUNTER — Encounter: Payer: Self-pay | Admitting: *Deleted

## 2015-07-07 ENCOUNTER — Encounter
Admission: RE | Disposition: A | Discharge status: Home / Self Care | Payer: Self-pay | Source: Ambulatory Visit | Attending: Vascular Surgery

## 2015-07-07 DIAGNOSIS — Z89611 Acquired absence of right leg above knee: Secondary | ICD-10-CM | POA: Diagnosis not present

## 2015-07-07 DIAGNOSIS — I7025 Atherosclerosis of native arteries of other extremities with ulceration: Secondary | ICD-10-CM | POA: Insufficient documentation

## 2015-07-07 DIAGNOSIS — Z7951 Long term (current) use of inhaled steroids: Secondary | ICD-10-CM | POA: Insufficient documentation

## 2015-07-07 DIAGNOSIS — I714 Abdominal aortic aneurysm, without rupture: Secondary | ICD-10-CM | POA: Diagnosis not present

## 2015-07-07 DIAGNOSIS — I10 Essential (primary) hypertension: Secondary | ICD-10-CM | POA: Insufficient documentation

## 2015-07-07 DIAGNOSIS — M6281 Muscle weakness (generalized): Secondary | ICD-10-CM | POA: Insufficient documentation

## 2015-07-07 DIAGNOSIS — E785 Hyperlipidemia, unspecified: Secondary | ICD-10-CM | POA: Insufficient documentation

## 2015-07-07 DIAGNOSIS — Z681 Body mass index (BMI) 19 or less, adult: Secondary | ICD-10-CM | POA: Insufficient documentation

## 2015-07-07 DIAGNOSIS — Z882 Allergy status to sulfonamides status: Secondary | ICD-10-CM | POA: Insufficient documentation

## 2015-07-07 DIAGNOSIS — F17200 Nicotine dependence, unspecified, uncomplicated: Secondary | ICD-10-CM | POA: Diagnosis not present

## 2015-07-07 DIAGNOSIS — I723 Aneurysm of iliac artery: Secondary | ICD-10-CM | POA: Insufficient documentation

## 2015-07-07 DIAGNOSIS — I739 Peripheral vascular disease, unspecified: Secondary | ICD-10-CM | POA: Insufficient documentation

## 2015-07-07 DIAGNOSIS — Z79899 Other long term (current) drug therapy: Secondary | ICD-10-CM | POA: Insufficient documentation

## 2015-07-07 DIAGNOSIS — L97421 Non-pressure chronic ulcer of left heel and midfoot limited to breakdown of skin: Secondary | ICD-10-CM | POA: Insufficient documentation

## 2015-07-07 HISTORY — PX: PERIPHERAL VASCULAR CATHETERIZATION: SHX172C

## 2015-07-07 SURGERY — LOWER EXTREMITY ANGIOGRAPHY
Anesthesia: Moderate Sedation | Laterality: Left

## 2015-07-07 MED ORDER — ONDANSETRON HCL 4 MG/2ML IJ SOLN: 4.0000 mg | Freq: Four times a day (QID) | INTRAMUSCULAR | Status: DC | PRN

## 2015-07-07 MED ORDER — LIDOCAINE-EPINEPHRINE (PF) 1 %-1:200000 IJ SOLN
INTRAMUSCULAR | Status: AC
  Filled 2015-07-07: qty 30

## 2015-07-07 MED ORDER — MIDAZOLAM HCL 5 MG/5ML IJ SOLN
INTRAMUSCULAR | Status: AC
  Filled 2015-07-07: qty 5

## 2015-07-07 MED ORDER — FENTANYL CITRATE (PF) 100 MCG/2ML IJ SOLN
INTRAMUSCULAR | Status: DC | PRN
  Administered 2015-07-07: 50 ug via INTRAVENOUS
  Administered 2015-07-07: 25 ug via INTRAVENOUS

## 2015-07-07 MED ORDER — HEPARIN SODIUM (PORCINE) 1000 UNIT/ML IJ SOLN
INTRAMUSCULAR | Status: DC | PRN
  Administered 2015-07-07: 4000 [IU] via INTRAVENOUS

## 2015-07-07 MED ORDER — DEXTROSE 5 % IV SOLN
1.5000 g | INTRAVENOUS | Status: AC
  Administered 2015-07-07: 1.5 g via INTRAVENOUS

## 2015-07-07 MED ORDER — IOPAMIDOL (ISOVUE-300) INJECTION 61%
INTRAVENOUS | Status: DC | PRN
Start: 2015-07-07 — End: 2015-07-07
  Administered 2015-07-07: 50 mL via INTRA_ARTERIAL

## 2015-07-07 MED ORDER — SODIUM CHLORIDE 0.9 % IV SOLN
INTRAVENOUS | Status: DC
  Administered 2015-07-07: 09:00:00 via INTRAVENOUS

## 2015-07-07 MED ORDER — HYDRALAZINE HCL 20 MG/ML IJ SOLN
INTRAMUSCULAR | Status: DC | PRN
  Administered 2015-07-07 (×2): 10 mg via INTRAVENOUS

## 2015-07-07 MED ORDER — FAMOTIDINE 20 MG PO TABS: 40.0000 mg | ORAL_TABLET | ORAL | Status: DC | PRN

## 2015-07-07 MED ORDER — METHYLPREDNISOLONE SODIUM SUCC 125 MG IJ SOLR: 125.0000 mg | INTRAMUSCULAR | Status: DC | PRN

## 2015-07-07 MED ORDER — LIDOCAINE-EPINEPHRINE (PF) 1 %-1:200000 IJ SOLN
INTRAMUSCULAR | Status: DC | PRN
  Administered 2015-07-07: 10 mL via INTRADERMAL

## 2015-07-07 MED ORDER — HYDROMORPHONE HCL 1 MG/ML IJ SOLN: 1.0000 mg | Freq: Once | INTRAMUSCULAR | Status: DC

## 2015-07-07 MED ORDER — LIDOCAINE HCL (PF) 1 % IJ SOLN
INTRAMUSCULAR | Status: DC | PRN
  Administered 2015-07-07: 10 mL via INTRADERMAL

## 2015-07-07 MED ORDER — HEPARIN SODIUM (PORCINE) 1000 UNIT/ML IJ SOLN
INTRAMUSCULAR | Status: AC
  Filled 2015-07-07: qty 1

## 2015-07-07 MED ORDER — HYDRALAZINE HCL 20 MG/ML IJ SOLN
INTRAMUSCULAR | Status: AC
  Filled 2015-07-07: qty 1

## 2015-07-07 MED ORDER — HEPARIN (PORCINE) IN NACL 2-0.9 UNIT/ML-% IJ SOLN
INTRAMUSCULAR | Status: DC | PRN
  Administered 2015-07-07: 09:00:00

## 2015-07-07 MED ORDER — FENTANYL CITRATE (PF) 100 MCG/2ML IJ SOLN
INTRAMUSCULAR | Status: AC
  Filled 2015-07-07: qty 2

## 2015-07-07 MED ORDER — HEPARIN (PORCINE) IN NACL 2-0.9 UNIT/ML-% IJ SOLN
INTRAMUSCULAR | Status: AC
  Filled 2015-07-07: qty 1000

## 2015-07-07 MED ORDER — MIDAZOLAM HCL 2 MG/2ML IJ SOLN
INTRAMUSCULAR | Status: DC | PRN
  Administered 2015-07-07: 2 mg via INTRAVENOUS
  Administered 2015-07-07: 1 mg via INTRAVENOUS

## 2015-07-07 SURGICAL SUPPLY — 16 items
CATH CXI 4F 90 DAV (MICROCATHETER) ×3 IMPLANT
CATH KA2 5FR 65CM (CATHETERS) ×3 IMPLANT
CATH PIG 70CM (CATHETERS) ×6 IMPLANT
CATH RIM 65CM (CATHETERS) ×3 IMPLANT
COVER PROBE U/S 5X48 (MISCELLANEOUS) ×3 IMPLANT
DEVICE PRESTO INFLATION (MISCELLANEOUS) ×3 IMPLANT
DEVICE STARCLOSE SE CLOSURE (Vascular Products) ×3 IMPLANT
DEVICE TORQUE (MISCELLANEOUS) ×3 IMPLANT
GLIDEWIRE ADV .035X260CM (WIRE) ×3 IMPLANT
PACK ANGIOGRAPHY (CUSTOM PROCEDURE TRAY) ×3 IMPLANT
SHEATH BRITE TIP 5FRX11 (SHEATH) ×3 IMPLANT
SHEATH HIGHFLEX ANSEL 6FRX55 (SHEATH) ×3 IMPLANT
SYR MEDRAD MARK V 150ML (SYRINGE) ×3 IMPLANT
TUBING CONTRAST HIGH PRESS 72 (TUBING) ×3 IMPLANT
WIRE G 018X200 V18 (WIRE) ×3 IMPLANT
WIRE J 3MM .035X145CM (WIRE) ×3 IMPLANT

## 2015-07-07 NOTE — Op Note (Signed)
[facility] Percutaneous Study/Intervention Procedural Note   Date of Surgery: 07/07/2015  Surgeon(s):DEW,JASON   Assistants:none  Pre-operative Diagnosis: PAD with ulceration left lower extremity  Post-operative diagnosis: Same  Procedure(s) Performed: 1. Ultrasound guidance for vascular access right femoral artery 2. Catheter placement into left superficial femoral artery from right femoral approach 3. Aortogram and selective left lower extremity angiogram 4. StarClose closure device right femoral artery  EBL: 30 cc  Contrast: 50 cc  Fluoro Time: 15.8 minutes  Moderate Conscious Sedation Time: approximately 45 minutes using 3 mg of Versed and 75 mcg of Fentanyl  Indications: Patient is a 82 y.o.female with pain and nonhealing ulcerations of the left foot. The patient has noninvasive study showing reduction in flow in the left lower extremity. The patient is brought in for angiography for further evaluation and potential treatment. Risks and benefits are discussed and informed consent is obtained  Procedure: The patient was identified and appropriate procedural time out was performed. The patient was then placed supine on the table and prepped and draped in the usual sterile fashion.Moderate conscious sedation was administered during a face to face encounter with the patient throughout the procedure with my supervision of the RN administering medicines and monitoring the patient's vital signs, pulse oximetry, telemetry and mental status throughout from the start of the procedure until the patient was taken to the recovery room. Ultrasound was used to evaluate the right common femoral artery. It was patent . A digital ultrasound image was acquired. A Seldinger needle was used to access the right common femoral artery under direct ultrasound guidance and a permanent image was performed. A  0.035 J wire was advanced without resistance and a 5Fr sheath was placed. Pigtail catheter was placed into the aorta and an AP aortogram was performed. This demonstrated normal renal arteries and ectatic aorta and iliac segments without significant stenosis. I then crossed the aortic bifurcation and advanced to the left femoral head. Selective left lower extremity angiogram was then performed. This demonstrated Flush occlusion of the left SFA with profunda femoris artery providing collateral flow. Reconstitution at Hunter's canal. One-vessel runoff distally through the peroneal artery with what appeared to be a moderate stenosis of the tibioperoneal trunk. The patient was systemically heparinized and a 6 [place] Ansell sheath was then placed over the [employer] wire. I then used a Kumpe catheter and the advantage wire to get into the initial portion of the superficial femoral artery. Despite multiple wires and catheters including a CXI catheter, a Kumpe catheter, of the 18 wire, an advantage wire and multiple different projections and obliquities, I could never get a wire or catheter to go beyond the first centimeter or 2 of the superficial femoral artery to cross the occlusion. After ever 15 minutes of fluoroscopy, it was clear this was not going to be successful with our Endeavor today. I would consider bringing her back at a later date for a popliteal access or consider open surgical therapy. I elected to terminate the procedure. The sheath was removed and StarClose closure device was deployed in the right femoral artery with excellent hemostatic result. The patient was taken to the recovery room in stable condition having tolerated the procedure well.  Findings:  Aortogram: Ectatic aorta and iliac arteries without high-grade stenosis. Renal arteries appeared patent. Left Lower Extremity: Flush occlusion of the left SFA with profunda femoris artery providing collateral flow.  Reconstitution at Hunter's canal. One-vessel runoff distally through the peroneal artery with what appeared to  be a moderate stenosis of the tibioperoneal trunk.   Disposition: Patient was taken to the recovery room in stable condition having tolerated the procedure well.  Complications: None  DEW,JASON 07/07/2015 10:09 AM

## 2015-07-07 NOTE — Discharge Instructions (Signed)
Angiogram, Care After °Refer to this sheet in the next few weeks. These instructions provide you with information about caring for yourself after your procedure. Your health care provider may also give you more specific instructions. Your treatment has been planned according to current medical practices, but problems sometimes occur. Call your health care provider if you have any problems or questions after your procedure. °WHAT TO EXPECT AFTER THE PROCEDURE °After your procedure, it is typical to have the following: °· Bruising at the catheter insertion site that usually fades within 1-2 weeks. °· Blood collecting in the tissue (hematoma) that may be painful to the touch. It should usually decrease in size and tenderness within 1-2 weeks. °HOME CARE INSTRUCTIONS °· Take medicines only as directed by your health care provider. °· You may shower 24-48 hours after the procedure or as directed by your health care provider. Remove the bandage (dressing) and gently wash the site with plain soap and water. Pat the area dry with a clean towel. Do not rub the site, because this may cause bleeding. °· Do not take baths, swim, or use a hot tub until your health care provider approves. °· Check your insertion site every day for redness, swelling, or drainage. °· Do not apply powder or lotion to the site. °· Do not lift over 10 lb (4.5 kg) for 5 days after your procedure or as directed by your health care provider. °· Ask your health care provider when it is okay to: °¨ Return to work or school. °¨ Resume usual physical activities or sports. °¨ Resume sexual activity. °· Do not drive home if you are discharged the same day as the procedure. Have someone else drive you. °· You may drive 24 hours after the procedure unless otherwise instructed by your health care provider. °· Do not operate machinery or power tools for 24 hours after the procedure or as directed by your health care provider. °· If your procedure was done as an  outpatient procedure, which means that you went home the same day as your procedure, a responsible adult should be with you for the first 24 hours after you arrive home. °· Keep all follow-up visits as directed by your health care provider. This is important. °SEEK MEDICAL CARE IF: °· You have a fever. °· You have chills. °· You have increased bleeding from the catheter insertion site. Hold pressure on the site. °SEEK IMMEDIATE MEDICAL CARE IF: °· You have unusual pain at the catheter insertion site. °· You have redness, warmth, or swelling at the catheter insertion site. °· You have drainage (other than a small amount of blood on the dressing) from the catheter insertion site. °· The catheter insertion site is bleeding, and the bleeding does not stop after 30 minutes of holding steady pressure on the site. °· The area near or just beyond the catheter insertion site becomes pale, cool, tingly, or numb. °  °This information is not intended to replace advice given to you by your health care provider. Make sure you discuss any questions you have with your health care provider. °  °Document Released: 08/27/2004 Document Revised: 03/01/2014 Document Reviewed: 07/12/2012 °Elsevier Interactive Patient Education ©2016 [employer] Inc[employer] ° °

## 2015-07-07 NOTE — H&P (Signed)
  [facility] History & Physical Update  The patient was interviewed and re-examined.  The patient's previous History and Physical has been reviewed and is unchanged.  There is no change in the plan of care. We plan to proceed with the scheduled procedure.  DEW,JASON, MD  07/07/2015, 8:05 AM

## 2015-07-08 ENCOUNTER — Encounter: Payer: Self-pay | Admitting: Vascular Surgery

## 2015-10-01 ENCOUNTER — Encounter: Payer: Self-pay | Admitting: *Deleted

## 2015-10-11 ENCOUNTER — Observation Stay
Admission: EM | Admit: 2015-10-11 | Discharge: 2015-10-15 | Disposition: A | Discharge status: Home / Self Care | Payer: Medicare Other | Attending: Internal Medicine | Admitting: Internal Medicine

## 2015-10-11 ENCOUNTER — Emergency Department: Payer: Medicare Other

## 2015-10-11 DIAGNOSIS — Z89611 Acquired absence of right leg above knee: Secondary | ICD-10-CM | POA: Diagnosis not present

## 2015-10-11 DIAGNOSIS — Z7982 Long term (current) use of aspirin: Secondary | ICD-10-CM | POA: Insufficient documentation

## 2015-10-11 DIAGNOSIS — Z882 Allergy status to sulfonamides status: Secondary | ICD-10-CM | POA: Insufficient documentation

## 2015-10-11 DIAGNOSIS — R748 Abnormal levels of other serum enzymes: Secondary | ICD-10-CM | POA: Insufficient documentation

## 2015-10-11 DIAGNOSIS — R778 Other specified abnormalities of plasma proteins: Secondary | ICD-10-CM

## 2015-10-11 DIAGNOSIS — K219 Gastro-esophageal reflux disease without esophagitis: Secondary | ICD-10-CM | POA: Diagnosis not present

## 2015-10-11 DIAGNOSIS — Z515 Encounter for palliative care: Secondary | ICD-10-CM | POA: Insufficient documentation

## 2015-10-11 DIAGNOSIS — Z8679 Personal history of other diseases of the circulatory system: Secondary | ICD-10-CM | POA: Diagnosis not present

## 2015-10-11 DIAGNOSIS — L97529 Non-pressure chronic ulcer of other part of left foot with unspecified severity: Secondary | ICD-10-CM | POA: Diagnosis not present

## 2015-10-11 DIAGNOSIS — I119 Hypertensive heart disease without heart failure: Secondary | ICD-10-CM | POA: Diagnosis not present

## 2015-10-11 DIAGNOSIS — R531 Weakness: Secondary | ICD-10-CM

## 2015-10-11 DIAGNOSIS — E7800 Pure hypercholesterolemia, unspecified: Secondary | ICD-10-CM | POA: Insufficient documentation

## 2015-10-11 DIAGNOSIS — I340 Nonrheumatic mitral (valve) insufficiency: Secondary | ICD-10-CM | POA: Insufficient documentation

## 2015-10-11 DIAGNOSIS — F03918 Unspecified dementia, unspecified severity, with other behavioral disturbance: Secondary | ICD-10-CM

## 2015-10-11 DIAGNOSIS — Z86718 Personal history of other venous thrombosis and embolism: Secondary | ICD-10-CM | POA: Diagnosis not present

## 2015-10-11 DIAGNOSIS — Z7902 Long term (current) use of antithrombotics/antiplatelets: Secondary | ICD-10-CM | POA: Diagnosis not present

## 2015-10-11 DIAGNOSIS — E876 Hypokalemia: Secondary | ICD-10-CM | POA: Diagnosis present

## 2015-10-11 DIAGNOSIS — R7989 Other specified abnormal findings of blood chemistry: Secondary | ICD-10-CM

## 2015-10-11 DIAGNOSIS — J449 Chronic obstructive pulmonary disease, unspecified: Secondary | ICD-10-CM | POA: Diagnosis not present

## 2015-10-11 DIAGNOSIS — I739 Peripheral vascular disease, unspecified: Secondary | ICD-10-CM | POA: Diagnosis not present

## 2015-10-11 DIAGNOSIS — F0391 Unspecified dementia with behavioral disturbance: Secondary | ICD-10-CM | POA: Insufficient documentation

## 2015-10-11 DIAGNOSIS — F17220 Nicotine dependence, chewing tobacco, uncomplicated: Secondary | ICD-10-CM | POA: Diagnosis not present

## 2015-10-11 DIAGNOSIS — M79672 Pain in left foot: Secondary | ICD-10-CM

## 2015-10-11 DIAGNOSIS — Z66 Do not resuscitate: Secondary | ICD-10-CM | POA: Diagnosis not present

## 2015-10-11 DIAGNOSIS — I160 Hypertensive urgency: Secondary | ICD-10-CM | POA: Diagnosis not present

## 2015-10-11 DIAGNOSIS — Z79891 Long term (current) use of opiate analgesic: Secondary | ICD-10-CM | POA: Diagnosis not present

## 2015-10-11 DIAGNOSIS — F419 Anxiety disorder, unspecified: Secondary | ICD-10-CM | POA: Insufficient documentation

## 2015-10-11 DIAGNOSIS — M85872 Other specified disorders of bone density and structure, left ankle and foot: Secondary | ICD-10-CM | POA: Insufficient documentation

## 2015-10-11 DIAGNOSIS — Z993 Dependence on wheelchair: Secondary | ICD-10-CM | POA: Insufficient documentation

## 2015-10-11 DIAGNOSIS — I493 Ventricular premature depolarization: Secondary | ICD-10-CM | POA: Insufficient documentation

## 2015-10-11 DIAGNOSIS — Z79899 Other long term (current) drug therapy: Secondary | ICD-10-CM | POA: Insufficient documentation

## 2015-10-11 DIAGNOSIS — Z791 Long term (current) use of non-steroidal anti-inflammatories (NSAID): Secondary | ICD-10-CM | POA: Insufficient documentation

## 2015-10-11 DIAGNOSIS — R627 Adult failure to thrive: Secondary | ICD-10-CM | POA: Insufficient documentation

## 2015-10-11 LAB — COMPREHENSIVE METABOLIC PANEL
ALT: 10 U/L — AB (ref 14–54)
ANION GAP: 9 (ref 5–15)
AST: 18 U/L (ref 15–41)
Albumin: 4.1 g/dL (ref 3.5–5.0)
Alkaline Phosphatase: 66 U/L (ref 38–126)
BUN: 14 mg/dL (ref 6–20)
CALCIUM: 9.6 mg/dL (ref 8.9–10.3)
CO2: 28 mmol/L (ref 22–32)
Chloride: 106 mmol/L (ref 101–111)
Creatinine, Ser: 0.68 mg/dL (ref 0.44–1.00)
GFR calc non Af Amer: >60 mL/min (ref >60)
GLUCOSE: 93 mg/dL (ref 65–99)
Potassium: 3 mmol/L — ABNORMAL LOW (ref 3.5–5.1)
SODIUM: 143 mmol/L (ref 135–145)
TOTAL PROTEIN: 7.5 g/dL (ref 6.5–8.1)
Total Bilirubin: 0.5 mg/dL (ref 0.3–1.2)

## 2015-10-11 LAB — URINALYSIS COMPLETE WITH MICROSCOPIC (ARMC ONLY)
BACTERIA UA: NONE SEEN
Bilirubin Urine: NEGATIVE
Glucose, UA: NEGATIVE mg/dL
Ketones, ur: NEGATIVE mg/dL
LEUKOCYTES UA: NEGATIVE
Nitrite: NEGATIVE
PH: 5 (ref 5.0–8.0)
PROTEIN: 100 mg/dL — AB
SQUAMOUS EPITHELIAL / LPF: NONE SEEN
Specific Gravity, Urine: 1.018 (ref 1.005–1.030)

## 2015-10-11 LAB — CBC
HCT: 38.8 % (ref 35.0–47.0)
Hemoglobin: 12.7 g/dL (ref 12.0–16.0)
MCH: 28.5 pg (ref 26.0–34.0)
MCHC: 32.8 g/dL (ref 32.0–36.0)
MCV: 86.8 fL (ref 80.0–100.0)
Platelets: 215 10*3/uL (ref 150–440)
RBC: 4.47 MIL/uL (ref 3.80–5.20)
RDW: 18.7 % — ABNORMAL HIGH (ref 11.5–14.5)
WBC: 8.2 10*3/uL (ref 3.6–11.0)

## 2015-10-11 LAB — TROPONIN I: Troponin I: <0.03 ng/mL (ref <0.03)

## 2015-10-11 LAB — MAGNESIUM: Magnesium: 1.5 mg/dL — ABNORMAL LOW (ref 1.7–2.4)

## 2015-10-11 MED ORDER — SENNOSIDES-DOCUSATE SODIUM 8.6-50 MG PO TABS
1.0000 | ORAL_TABLET | Freq: Every evening | ORAL | Status: DC | PRN
  Administered 2015-10-12: 1 via ORAL
  Filled 2015-10-11: qty 1

## 2015-10-11 MED ORDER — POTASSIUM CHLORIDE 20 MEQ PO PACK
40.0000 meq | PACK | Freq: Once | ORAL | Status: AC
  Administered 2015-10-11: 40 meq via ORAL

## 2015-10-11 MED ORDER — SODIUM CHLORIDE 0.9% FLUSH
3.0000 mL | Freq: Two times a day (BID) | INTRAVENOUS | Status: DC
  Administered 2015-10-11 – 2015-10-15 (×7): 3 mL via INTRAVENOUS

## 2015-10-11 MED ORDER — HYDRALAZINE HCL 20 MG/ML IJ SOLN
10.0000 mg | Freq: Once | INTRAMUSCULAR | Status: AC
  Administered 2015-10-11: 10 mg via INTRAVENOUS
  Filled 2015-10-11: qty 1

## 2015-10-11 MED ORDER — ASPIRIN EC 81 MG PO TBEC
81.0000 mg | DELAYED_RELEASE_TABLET | Freq: Every day | ORAL | Status: DC
  Administered 2015-10-12 – 2015-10-14 (×3): 81 mg via ORAL
  Filled 2015-10-11 (×3): qty 1

## 2015-10-11 MED ORDER — ONDANSETRON HCL 4 MG/2ML IJ SOLN: 4.0000 mg | Freq: Four times a day (QID) | INTRAMUSCULAR | Status: DC | PRN

## 2015-10-11 MED ORDER — PRAVASTATIN SODIUM 10 MG PO TABS
10.0000 mg | ORAL_TABLET | Freq: Every day | ORAL | Status: DC
  Administered 2015-10-12 – 2015-10-14 (×3): 10 mg via ORAL
  Filled 2015-10-11 (×3): qty 1

## 2015-10-11 MED ORDER — NAPROXEN 250 MG PO TABS
250.0000 mg | ORAL_TABLET | Freq: Two times a day (BID) | ORAL | Status: DC
  Administered 2015-10-12 – 2015-10-14 (×3): 250 mg via ORAL
  Filled 2015-10-11 (×8): qty 1

## 2015-10-11 MED ORDER — LISINOPRIL 10 MG PO TABS: 10.0000 mg | ORAL_TABLET | Freq: Two times a day (BID) | ORAL | Status: DC

## 2015-10-11 MED ORDER — SODIUM CHLORIDE 0.9 % IV BOLUS (SEPSIS)
1000.0000 mL | Freq: Once | INTRAVENOUS | Status: AC
  Administered 2015-10-11: 1000 mL via INTRAVENOUS

## 2015-10-11 MED ORDER — LABETALOL HCL 5 MG/ML IV SOLN
10.0000 mg | Freq: Once | INTRAVENOUS | Status: AC
  Administered 2015-10-11: 10 mg via INTRAVENOUS
  Filled 2015-10-11: qty 4

## 2015-10-11 MED ORDER — PANTOPRAZOLE SODIUM 40 MG PO TBEC
40.0000 mg | DELAYED_RELEASE_TABLET | Freq: Every day | ORAL | Status: DC
  Administered 2015-10-12 – 2015-10-14 (×3): 40 mg via ORAL
  Filled 2015-10-11 (×4): qty 1

## 2015-10-11 MED ORDER — BISACODYL 5 MG PO TBEC
5.0000 mg | DELAYED_RELEASE_TABLET | Freq: Every day | ORAL | Status: DC | PRN
  Administered 2015-10-12: 5 mg via ORAL
  Filled 2015-10-11: qty 1

## 2015-10-11 MED ORDER — POTASSIUM CHLORIDE ER 10 MEQ PO TBCR: 10.0000 meq | EXTENDED_RELEASE_TABLET | Freq: Every day | ORAL | 1 refills | Status: AC

## 2015-10-11 MED ORDER — CLONIDINE HCL 0.1 MG PO TABS
0.3000 mg | ORAL_TABLET | Freq: Two times a day (BID) | ORAL | Status: DC
  Administered 2015-10-12 – 2015-10-15 (×6): 0.3 mg via ORAL
  Filled 2015-10-11 (×8): qty 3

## 2015-10-11 MED ORDER — MAGNESIUM CITRATE PO SOLN
1.0000 | Freq: Once | ORAL | Status: DC | PRN
  Filled 2015-10-11: qty 296

## 2015-10-11 MED ORDER — LISINOPRIL 10 MG PO TABS
10.0000 mg | ORAL_TABLET | Freq: Once | ORAL | Status: AC
  Administered 2015-10-11: 10 mg via ORAL
  Filled 2015-10-11: qty 1

## 2015-10-11 MED ORDER — DOCUSATE SODIUM 100 MG PO CAPS
100.0000 mg | ORAL_CAPSULE | Freq: Two times a day (BID) | ORAL | Status: DC
Start: 2015-10-11 — End: 2015-10-15
  Administered 2015-10-12 – 2015-10-14 (×4): 100 mg via ORAL
  Filled 2015-10-11 (×7): qty 1

## 2015-10-11 MED ORDER — POTASSIUM CHLORIDE IN NACL 20-0.45 MEQ/L-% IV SOLN
INTRAVENOUS | Status: DC
  Administered 2015-10-11: via INTRAVENOUS
  Filled 2015-10-11 (×2): qty 1000

## 2015-10-11 MED ORDER — HYDROCHLOROTHIAZIDE 25 MG PO TABS: 25.0000 mg | ORAL_TABLET | Freq: Every day | ORAL | Status: DC

## 2015-10-11 MED ORDER — MAGNESIUM CHLORIDE 64 MG PO TBEC: 1.0000 | DELAYED_RELEASE_TABLET | Freq: Every day | ORAL | 1 refills | Status: AC

## 2015-10-11 MED ORDER — DULOXETINE HCL 20 MG PO CPEP
20.0000 mg | ORAL_CAPSULE | Freq: Every day | ORAL | Status: DC
  Administered 2015-10-12 – 2015-10-13 (×2): 20 mg via ORAL
  Filled 2015-10-11 (×2): qty 1

## 2015-10-11 MED ORDER — POTASSIUM CHLORIDE 10 MEQ/100ML IV SOLN
10.0000 meq | Freq: Once | INTRAVENOUS | Status: DC
  Filled 2015-10-11: qty 100

## 2015-10-11 MED ORDER — POTASSIUM CHLORIDE 20 MEQ PO PACK
PACK | ORAL | Status: AC
  Administered 2015-10-11: 40 meq via ORAL
  Filled 2015-10-11: qty 2

## 2015-10-11 MED ORDER — VITAMIN D (ERGOCALCIFEROL) 1.25 MG (50000 UNIT) PO CAPS
50000.0000 [IU] | ORAL_CAPSULE | ORAL | Status: DC
  Administered 2015-10-12: 50000 [IU] via ORAL
  Filled 2015-10-11: qty 1

## 2015-10-11 MED ORDER — CALCIUM CARBONATE ANTACID 500 MG PO CHEW: 1.0000 | CHEWABLE_TABLET | Freq: Every day | ORAL | Status: DC | PRN

## 2015-10-11 MED ORDER — ENOXAPARIN SODIUM 40 MG/0.4ML ~~LOC~~ SOLN
40.0000 mg | SUBCUTANEOUS | Status: DC
  Administered 2015-10-11 – 2015-10-13 (×3): 40 mg via SUBCUTANEOUS
  Filled 2015-10-11 (×3): qty 0.4

## 2015-10-11 MED ORDER — CLONIDINE HCL 0.1 MG PO TABS
0.1000 mg | ORAL_TABLET | Freq: Once | ORAL | Status: AC
  Administered 2015-10-11: 0.1 mg via ORAL
  Filled 2015-10-11: qty 1

## 2015-10-11 MED ORDER — ONDANSETRON HCL 4 MG PO TABS: 4.0000 mg | ORAL_TABLET | Freq: Four times a day (QID) | ORAL | Status: DC | PRN

## 2015-10-11 MED ORDER — POTASSIUM CHLORIDE CRYS ER 20 MEQ PO TBCR
40.0000 meq | EXTENDED_RELEASE_TABLET | Freq: Once | ORAL | Status: DC
Start: 2015-10-11 — End: 2015-10-11

## 2015-10-11 MED ORDER — ZOLPIDEM TARTRATE 5 MG PO TABS: 5.0000 mg | ORAL_TABLET | Freq: Every evening | ORAL | Status: DC | PRN

## 2015-10-11 MED ORDER — LISINOPRIL 20 MG PO TABS
ORAL_TABLET | ORAL | Status: AC
  Administered 2015-10-11: 10 mg via ORAL
  Filled 2015-10-11: qty 1

## 2015-10-11 MED ORDER — HYDROCODONE-ACETAMINOPHEN 5-325 MG PO TABS
1.0000 | ORAL_TABLET | Freq: Four times a day (QID) | ORAL | Status: DC | PRN
  Administered 2015-10-12: 1 via ORAL
  Filled 2015-10-11: qty 1

## 2015-10-11 MED ORDER — OXYCODONE HCL 5 MG PO TABS
5.0000 mg | ORAL_TABLET | ORAL | Status: DC | PRN
  Administered 2015-10-13: 5 mg via ORAL
  Filled 2015-10-11: qty 1

## 2015-10-11 MED ORDER — AMLODIPINE BESYLATE 10 MG PO TABS
10.0000 mg | ORAL_TABLET | Freq: Every day | ORAL | Status: DC
  Administered 2015-10-12 – 2015-10-15 (×4): 10 mg via ORAL
  Filled 2015-10-11 (×5): qty 1

## 2015-10-11 MED ORDER — MAGNESIUM SULFATE 2 GM/50ML IV SOLN
2.0000 g | Freq: Once | INTRAVENOUS | Status: AC
  Administered 2015-10-11: 2 g via INTRAVENOUS
  Filled 2015-10-11: qty 50

## 2015-10-11 NOTE — H&P (Signed)
[facility] [place] [facility] Ad[facility] History and Physical [employer] Marcelino Campos, D.O.  ---------------------------------------------------------------------------------------------------------------------   PATIENT NAME: Gabrielle Lyons MR#: [id number] DATE OF BIRTH: 06/11/1933 DATE OF ADMISSION: 10/11/2015 REQUESTING/REFERRING PHYSICIAN: ED Dr. Don PerkingVeronese  CHIEF COMPLAINT: Chief Complaint  Patient presents with  . Weakness    HISTORY OF PRESENT ILLNESS: Gabrielle Lyons is a 82 y.o. female with a known history of Hypertension, hyperlipidemia, COPD, peripheral vascular disease status post right AKA in March this year was in a usual state of health until the past 2 weeks when her family reports that she has been increasingly weak and fatigued.  The most significant change for her the fact that she was unable to transfer from wheelchair for the past 2 days and has been increasingly somnolent. The patient herself has no complaints.  Otherwise there has been no change in status. Patient has been taking medication as prescribed and there has been no recent change in medication or diet.  There has been no recent illness, travel or sick contacts.    Patient denies fevers/chills, dizziness, chest pain, shortness of breath, N/V/C/D, abdominal pain, dysuria/frequency, changes in mental status.   EMS/ED COURSE:   In the emergency department the patient received her regular home doses of clonidine and lisinopril in addition to 10 of hydralazine, 10 of labetalol, 2 g of magnesium and 40 mEq of potassium  PAST MEDICAL HISTORY: Past Medical History:  Diagnosis Date  . Abdominal aneurysm (HCC)    also, "poor circulation" - legs  . COPD (chronic obstructive pulmonary disease) (HCC)   . GERD (gastroesophageal reflux disease)   . H/O blood clots    legs  . Hypercholesteremia   . Hypertension   . Mitral valve insufficiency   . Shortness of breath dyspnea    with exertion      PAST SURGICAL  HISTORY: Past Surgical History:  Procedure Laterality Date  . BREAST LUMPECTOMY Left   . CATARACT EXTRACTION W/PHACO Left 11/18/2014   Procedure: CATARACT EXTRACTION PHACO AND INTRAOCULAR LENS PLACEMENT (IOC);  Surgeon: Sherald HessAnita Prakash Vin-Parikh, MD;  [facility];  Service: Ophthalmology;  Laterality: Left;  . CYST EXCISION     back  . FEMORAL ARTERY STENT Right   . OVARIAN CYST REMOVAL    . PERIPHERAL VASCULAR CATHETERIZATION Right 05/12/2015   Procedure: Lower Extremity Angiography;  Surgeon: Annice NeedyJason S Dew, MD;  Location: [facility] INVASIVE [facility];  Service: Cardiovascular;  Laterality: Right;  . PERIPHERAL VASCULAR CATHETERIZATION  05/12/2015   Procedure: Lower Extremity Intervention;  Surgeon: Annice NeedyJason S Dew, MD;  Location: [facility];  Service: Cardiovascular;;  . PERIPHERAL VASCULAR CATHETERIZATION Left 07/07/2015   Procedure: Lower Extremity Angiography;  Surgeon: Annice NeedyJason S Dew, MD;  Location: [facility];  Service: Cardiovascular;  Laterality: Left;  . PERIPHERAL VASCULAR CATHETERIZATION  07/07/2015   Procedure: Peripheral Vascular Intervention;  Surgeon: Annice NeedyJason S Dew, MD;  Location: [facility];  Service: Cardiovascular;;  . rt leg AKA    . TOE AMPUTATION Right   . TONSILLECTOMY        SOCIAL HISTORY: Social History  Substance Use Topics  . Smoking status: Former Smoker    Quit date: 10/23/2013  . Smokeless tobacco: Current User    Types: Snuff  . Alcohol use No      FAMILY HISTORY: No family history on file.   MEDICATIONS AT HOME: Prior to Admission medications   Medication Sig Start Date End Date Taking? Authorizing Provider  aspirin  EC 81 MG tablet Take 81 mg by mouth every morning.   Yes Historical Provider, MD  DULoxetine (CYMBALTA) 30 MG capsule Take 30 mg by mouth daily. 09/08/15  Yes Historical Provider, MD  lisinopril (PRINIVIL,ZESTRIL) 20 MG tablet Take 20 mg by mouth daily. 06/27/15  Yes Historical Provider, MD  acetaminophen  (TYLENOL) 500 MG tablet Take 1,000 mg by mouth every 8 (eight) hours as needed.    Historical Provider, MD  amLODipine (NORVASC) 10 MG tablet Take 10 mg by mouth daily. Reported on 07/07/2015    Historical Provider, MD  aspirin 81 MG tablet Take 81 mg by mouth daily. AM    Historical Provider, MD  calcium carbonate (TUMS - DOSED IN MG ELEMENTAL CALCIUM) 500 MG chewable tablet Chew 1 tablet by mouth daily as needed for indigestion or heartburn.    Historical Provider, MD  cloNIDine (CATAPRES) 0.3 MG tablet Take 0.3 mg by mouth 2 (two) times daily.    Historical Provider, MD  clopidogrel (PLAVIX) 75 MG tablet Take 1 tablet (75 mg total) by mouth daily. Patient not taking: Reported on 07/07/2015 05/12/15   Annice Needy, MD  DULoxetine (CYMBALTA) 20 MG capsule Take 20 mg by mouth daily.    Historical Provider, MD  hydrochlorothiazide (HYDRODIURIL) 25 MG tablet Take 25 mg by mouth daily. Reported on 07/07/2015    Historical Provider, MD  HYDROcodone-acetaminophen (NORCO) 5-325 MG tablet Take 1 tablet by mouth every 6 (six) hours as needed for moderate pain. 05/12/15   Annice Needy, MD  lisinopril (PRINIVIL,ZESTRIL) 10 MG tablet Take 10 mg by mouth 2 (two) times daily.    Historical Provider, MD  magnesium chloride (SLOW-MAG) 64 MG TBEC SR tablet Take 1 tablet (64 mg total) by mouth daily. 10/11/15   Nita Sickle, MD  naproxen sodium (ANAPROX) 220 MG tablet Take 220 mg by mouth daily.    Historical Provider, MD  omeprazole (PRILOSEC) 20 MG capsule Take 20 mg by mouth 2 (two) times daily before a meal.    Historical Provider, MD  oxycodone (OXY-IR) 5 MG capsule Take 5 mg by mouth every 4 (four) hours as needed.    Historical Provider, MD  potassium chloride (K-DUR) 10 MEQ tablet Take 1 tablet (10 mEq total) by mouth daily. 10/11/15   Nita Sickle, MD  pravastatin (PRAVACHOL) 10 MG tablet Take 10 mg by mouth daily. PM    Historical Provider, MD  Vitamin D, Ergocalciferol, (DRISDOL) 50000 UNITS CAPS capsule  Take 50,000 Units by mouth every 7 (seven) days.    Historical Provider, MD      DRUG ALLERGIES: Allergies  Allergen Reactions  . Sulfa Antibiotics Hives     REVIEW OF SYSTEMS: CONSTITUTIONAL: No fever/chills, fatigue, weakness, weight gain/loss, headache EYES: No blurry or double vision. ENT: No tinnitus, postnasal drip, redness or soreness of the oropharynx. RESPIRATORY: No cough, wheeze, hemoptysis, dyspnea. CARDIOVASCULAR: No chest pain, orthopnea, palpitations, syncope. GASTROINTESTINAL: No nausea, vomiting, constipation, diarrhea, abdominal pain, hematemesis, melena or hematochezia. GENITOURINARY: No dysuria or hematuria. ENDOCRINE: No polyuria or nocturia. No heat or cold intolerance. HEMATOLOGY: No anemia, bruising, bleeding. INTEGUMENTARY: No rashes, ulcers, lesions. MUSCULOSKELETAL: No arthritis, swelling, gout. NEUROLOGIC: No numbness, tingling, weakness or ataxia. No seizure-type activity. PSYCHIATRIC: No anxiety, depression, insomnia.  PHYSICAL EXAMINATION: VITAL SIGNS: Blood pressure (!) 223/139, pulse 71, temperature 98.7 F (37.1 C), resp. rate 18, height 5\' 6"  (1.676 m), weight 54.4 kg (120 lb), SpO2 100 %.  GENERAL: 82 y.o.-year-old black female patient, well-developed, well-nourished lying  in the bed in no acute distress.  Pleasant and cooperative.   HEENT: Head atraumatic, normocephalic. Pupils equal, round, reactive to light and accommodation. No scleral icterus. Extraocular muscles intact. Nares are patent. Oropharynx is clear. Mucus membranes moist. NECK: Supple, full range of motion. No JVD, no bruit heard. No thyroid enlargement, no tenderness, no cervical lymphadenopathy. CHEST: Normal breath sounds bilaterally. No wheezing, rales, rhonchi or crackles. No use of accessory muscles of respiration.  No reproducible chest wall tenderness.  CARDIOVASCULAR: S1, S2 normal. No murmurs, rubs, or gallops. Cap refill <2 seconds. ABDOMEN: Soft, nontender,  nondistended. No rebound, guarding, rigidity. Normoactive bowel sounds present in all four quadrants. No organomegaly or mass. EXTREMITIES: Full range of motion. No pedal edema, cyanosis, or clubbing. Right AKA. NEUROLOGIC: Cranial nerves II through XII are grossly intact with no focal sensorimotor deficit. Muscle strength 5/5 in all extremities. Sensation intact. Gait not checked. PSYCHIATRIC: The patient is alert and oriented x 3. Normal affect, mood, thought content. SKIN: Warm, dry, and intact without obvious rash, lesion, or ulcer with the exception of a left great toe ulceration with a black eschar at the distal tip.  LABORATORY PANEL:  CBC  Recent Labs Lab 10/11/15 1727  WBC 8.2  HGB 12.7  HCT 38.8  PLT 215   ----------------------------------------------------------------------------------------------------------------- Chemistries  Recent Labs Lab 10/11/15 1727  NA 143  K 3.0*  CL 106  CO2 28  GLUCOSE 93  BUN 14  CREATININE 0.68  CALCIUM 9.6  MG 1.5*  AST 18  ALT 10*  ALKPHOS 66  BILITOT 0.5   ------------------------------------------------------------------------------------------------------------------ Cardiac Enzymes  Recent Labs Lab 10/11/15 1727  TROPONINI <0.03   ------------------------------------------------------------------------------------------------------------------  RADIOLOGY: Dg Chest 2 View  Result Date: 10/11/2015 CLINICAL DATA:  The last couple of days pt states she has been extremely weak and fatigue. Pt has an ulcer on the tip of her great toe on the left foot. EXAM: CHEST  2 VIEW COMPARISON:  None. FINDINGS: Hyperinflation. Patient rotated right on the frontal. Moderate cardiomegaly. Markedly tortuous, atherosclerotic thoracic aorta. No pleural effusion or pneumothorax. No congestive failure. Clear lungs. IMPRESSION: Cardiomegaly without congestive failure. Markedly tortuous, atherosclerotic thoracic aorta. Electronically Signed    By: Jeronimo Greaves M.D.   On: 10/11/2015 18:09   Dg Foot Complete Left  Result Date: 10/11/2015 CLINICAL DATA:  Left foot pain EXAM: LEFT FOOT - COMPLETE 3+ VIEW COMPARISON:  None. FINDINGS: Osteopenia.  No fracture or dislocation. IMPRESSION: Negative. Electronically Signed   By: Gerome Sam III M.D   On: 10/11/2015 18:10    EKG: Sinus at 65 bpm with PVCs, right bundle blanch block and nonspecific ST or T-wave abnormalities.  IMPRESSION AND PLAN:  This is a 82 y.o. female with a history of hypertension, hyperlipidemia, peripheral vascular disease, COPD and GERD now being admitted with: 1. Hypertensive urgency-patient's only real complaint at present is weakness and fatigue. We will admit to telemetry for blood pressure control, gentle IV fluid hydration with half normal saline. I will check a salicylate, acetaminophen level as well as a CPK. We'll also trend troponins although her first was negative. Initial sepsis workup has also been negative. 2. Hypokalemia-replacement has begun in the emergency department and we will continue. 3. Otherwise continue regular home medication.   Diet/Nutrition: Heart healthy Fluids: 1/2 normal saline DVT Px: Lovenox, SCDs and early ambulation Code Status: Full at this time. The initial conversation regarding advanced directives to place this evening with the patient, her sister and her niece.  All the records are reviewed and case discussed with ED provider. Management plans discussed with the patient and/or family who express understanding and agree with plan of care.   TOTAL TIME TAKING CARE OF THIS PATIENT: 60 minutes.   Yavier Snider D.O. on 10/11/2015 at 8:50 PM Between 7am to 6pm - Pager - [phone] After 6pm go to [email or web address] - Social research officer, governmentpassword EPAS [facility] Office [phone] CC: Primary care physician; [facility] Community Health CenterFELDPAUSCH, Madaline GuthrieALE E, MD     Note: This dictation was prepared with Dragon dictation along with  smaller phrase technology. Any transcriptional errors that result from this process are unintentional.

## 2015-10-11 NOTE — Discharge Instructions (Signed)
Take potassium and magnesium as prescribed. Follow up with her doctor Monday. Return to the emergency department if you have fever, chest pain, shortness of breath, abdominal pain, or any other symptoms concerning to you.

## 2015-10-11 NOTE — ED Triage Notes (Signed)
Pt presents via EMS c/o weakness x1 week. Also reports "voice change" per pt report. Lives at home with sister.

## 2015-10-11 NOTE — ED Provider Notes (Signed)
[facility] Emergency Department Provider Note  ____________________________________________  Time seen: Approximately 5:08 PM  I have reviewed the triage vital signs and the nursing notes.   HISTORY  Chief Complaint Weakness   HPI LYA HOLBEN is a 82 y.o. female history of abdominal aneurysm, COPD, GERD, peripheral vascular disease, hypertension, mitral valve insufficiency who presents for evaluation of weakness and fatigue. Patient is accompanied by her sister and niece who live with her. They report the patient has been very weak for the last 2 weeks and the fact that she was not getting better made them call EMS today. She is wheelchair bound and she has had an amputation of her left lower extremity due to peripheral vascular disease. Her niece reports the patient is usually able to move to and from the wheelchair but hasn't been able to do that in the last 2 days. She's been sleeping more. They have not noticed any changes in voice, facial droop, aphasia, unilateral weakness. Patient has had no changes in her diet however she doesn't relate much according to the family. No nausea, no vomiting, no abdominal pain, no dizziness, no headache, no chest pain, no shortness of breath, no dysuria, no hematuria, no chills, no fever. Patient has no complaints at this time and reports she just feels weak. Patient is no longer on blood thinners and hasn't been on them for a while according to her niece. Patient has missed her dose of lisinopril today.  Past Medical History:  Diagnosis Date  . Abdominal aneurysm (HCC)    also, "poor circulation" - legs  . COPD (chronic obstructive pulmonary disease) (HCC)   . GERD (gastroesophageal reflux disease)   . H/O blood clots    legs  . Hypercholesteremia   . Hypertension   . Mitral valve insufficiency   . Shortness of breath dyspnea    with exertion    There are no active problems to display for this patient.   Past  Surgical History:  Procedure Laterality Date  . BREAST LUMPECTOMY Left   . CATARACT EXTRACTION W/PHACO Left 11/18/2014   Procedure: CATARACT EXTRACTION PHACO AND INTRAOCULAR LENS PLACEMENT (IOC);  Surgeon: Sherald Hess, MD;  [facility]: [facility];  Service: Ophthalmology;  Laterality: Left;  . CYST EXCISION     back  . FEMORAL ARTERY STENT Right   . OVARIAN CYST REMOVAL    . PERIPHERAL VASCULAR CATHETERIZATION Right 05/12/2015   Procedure: Lower Extremity Angiography;  Surgeon: Annice Needy, MD;  Location: [facility] INVASIVE CV [facility];  Service: Cardiovascular;  Laterality: Right;  . PERIPHERAL VASCULAR CATHETERIZATION  05/12/2015   Procedure: Lower Extremity Intervention;  Surgeon: Annice Needy, MD;  Location: [facility];  Service: Cardiovascular;;  . PERIPHERAL VASCULAR CATHETERIZATION Left 07/07/2015   Procedure: Lower Extremity Angiography;  Surgeon: Annice Needy, MD;  Location: [facility];  Service: Cardiovascular;  Laterality: Left;  . PERIPHERAL VASCULAR CATHETERIZATION  07/07/2015   Procedure: Peripheral Vascular Intervention;  Surgeon: Annice Needy, MD;  Location: [facility];  Service: Cardiovascular;;  . rt leg AKA    . TOE AMPUTATION Right   . TONSILLECTOMY      Prior to Admission medications   Medication Sig Start Date End Date Taking? Authorizing Provider  acetaminophen (TYLENOL) 500 MG tablet Take 1,000 mg by mouth every 8 (eight) hours as needed.    Historical Provider, MD  amLODipine (NORVASC) 10 MG tablet Take 10 mg by mouth daily.  Reported on 07/07/2015    Historical Provider, MD  aspirin 81 MG tablet Take 81 mg by mouth daily. AM    Historical Provider, MD  calcium carbonate (TUMS - DOSED IN MG ELEMENTAL CALCIUM) 500 MG chewable tablet Chew 1 tablet by mouth daily as needed for indigestion or heartburn.    Historical Provider, MD  cloNIDine (CATAPRES) 0.3 MG tablet Take 0.3 mg by mouth 2 (two) times daily.    Historical Provider, MD    clopidogrel (PLAVIX) 75 MG tablet Take 1 tablet (75 mg total) by mouth daily. Patient not taking: Reported on 07/07/2015 05/12/15   Annice NeedyJason S Dew, MD  DULoxetine (CYMBALTA) 20 MG capsule Take 20 mg by mouth daily.    Historical Provider, MD  hydrochlorothiazide (HYDRODIURIL) 25 MG tablet Take 25 mg by mouth daily. Reported on 07/07/2015    Historical Provider, MD  HYDROcodone-acetaminophen (NORCO) 5-325 MG tablet Take 1 tablet by mouth every 6 (six) hours as needed for moderate pain. 05/12/15   Annice NeedyJason S Dew, MD  lisinopril (PRINIVIL,ZESTRIL) 10 MG tablet Take 10 mg by mouth 2 (two) times daily.    Historical Provider, MD  magnesium chloride (SLOW-MAG) 64 MG TBEC SR tablet Take 1 tablet (64 mg total) by mouth daily. 10/11/15   Nita Sicklearolina Carmon Brigandi, MD  naproxen sodium (ANAPROX) 220 MG tablet Take 220 mg by mouth daily.    Historical Provider, MD  omeprazole (PRILOSEC) 20 MG capsule Take 20 mg by mouth 2 (two) times daily before a meal.    Historical Provider, MD  oxycodone (OXY-IR) 5 MG capsule Take 5 mg by mouth every 4 (four) hours as needed.    Historical Provider, MD  potassium chloride (K-DUR) 10 MEQ tablet Take 1 tablet (10 mEq total) by mouth daily. 10/11/15   Nita Sicklearolina Carlye Panameno, MD  pravastatin (PRAVACHOL) 10 MG tablet Take 10 mg by mouth daily. PM    Historical Provider, MD  Vitamin D, Ergocalciferol, (DRISDOL) 50000 UNITS CAPS capsule Take 50,000 Units by mouth every 7 (seven) days.    Historical Provider, MD    Allergies Sulfa antibiotics  No family history on file.  Social History Social History  Substance Use Topics  . Smoking status: Former Smoker    Quit date: 10/23/2013  . Smokeless tobacco: Current User    Types: Snuff  . Alcohol use No    Review of Systems  Constitutional: Negative for fever. + Generalized weakness and fatigue Eyes: Negative for visual changes. ENT: Negative for sore throat. Cardiovascular: Negative for chest pain. Respiratory: Negative for shortness of  breath. Gastrointestinal: Negative for abdominal pain, vomiting or diarrhea. Genitourinary: Negative for dysuria. Musculoskeletal: Negative for back pain. Skin: Negative for rash. Neurological: Negative for headaches, weakness or numbness.  ____________________________________________   PHYSICAL EXAM:  VITAL SIGNS: ED Triage Vitals  Enc Vitals Group     BP 10/11/15 1648 (!) 160/130     Pulse Rate 10/11/15 1648 71     Resp 10/11/15 1648 16     Temp 10/11/15 1648 98.7 F (37.1 C)     Temp src --      SpO2 10/11/15 1648 100 %     Weight 10/11/15 1649 120 lb (54.4 kg)     Height 10/11/15 1649 5\' 6"  (1.676 m)     Head Circumference --      Peak Flow --      Pain Score --      Pain Loc --      Pain Edu? --  Excl. in GC? --     Constitutional: Alert and oriented. Well appearing and in no apparent distress. HEENT:      Head: Normocephalic and atraumatic.         Eyes: Conjunctivae are normal. Sclera is non-icteric. EOMI. PERRL      Mouth/Throat: Mucous membranes are dry.       Neck: Supple with no signs of meningismus. Cardiovascular: Regular rate and rhythm. No murmurs, gallops, or rubs. 2+ symmetrical distal pulses are present in all extremities. No JVD. Respiratory: Normal respiratory effort. Lungs are clear to auscultation bilaterally. No wheezes, crackles, or rhonchi.  Gastrointestinal: Soft, non tender, and non distended with positive bowel sounds. No rebound or guarding. Genitourinary: No CVA tenderness. Musculoskeletal: Nontender with normal range of motion in all extremities. No edema, cyanosis, or erythema of extremities. Ulcer on the R big toe Neurologic: Normal speech and language. Face is symmetric. Moving all extremities. No gross focal neurologic deficits are appreciated. Intact strength and sensation, negative pronator drift. No dysmetria Skin: Skin is warm, dry and intact. No rash noted. Psychiatric: Mood and affect are normal. Speech and behavior are  normal.  ____________________________________________   LABS (all labs ordered are listed, but only abnormal results are displayed)  Labs Reviewed  URINALYSIS COMPLETEWITH MICROSCOPIC ([facility] ONLY) - Abnormal; Notable for the following:       Result Value   Color, Urine YELLOW (*)    APPearance CLEAR (*)    Hgb urine dipstick 1+ (*)    Protein, ur 100 (*)    All other components within normal limits  COMPREHENSIVE METABOLIC PANEL - Abnormal; Notable for the following:    Potassium 3.0 (*)    ALT 10 (*)    All other components within normal limits  MAGNESIUM - Abnormal; Notable for the following:    Magnesium 1.5 (*)    All other components within normal limits  CBC - Abnormal; Notable for the following:    RDW 18.7 (*)    All other components within normal limits  URINE CULTURE  TROPONIN I  CBG MONITORING, ED   ____________________________________________  EKG  ED ECG REPORT I, Nita Sickle, the attending physician, personally viewed and interpreted this ECG.  Sinus rhythm, rate of 65, multiple PVCs, right bundle branch block, no ST elevations, non specific ST-T wave abnormalities in lateral leads. Unchanged from prior ____________________________________________  RADIOLOGY  CXR: Negative ____________________________________________   PROCEDURES  Procedure(s) performed: None Procedures Critical Care performed: yes  CRITICAL CARE Performed by: Nita Sickle  ?  Total critical care time: 35 min  Critical care time was exclusive of separately billable procedures and treating other patients.  Critical care was necessary to treat or prevent imminent or life-threatening deterioration.  Critical care was time spent personally by me on the following activities: development of treatment plan with patient and/or surrogate as well as nursing, discussions with consultants, evaluation of patient's response to treatment, examination of patient, obtaining  history from patient or surrogate, ordering and performing treatments and interventions, ordering and review of laboratory studies, ordering and review of radiographic studies, pulse oximetry and re-evaluation of patient's condition.  ____________________________________________   INITIAL IMPRESSION / ASSESSMENT AND PLAN / ED COURSE   82 y.o. female history of abdominal aneurysm, COPD, GERD, peripheral vascular disease, hypertension, mitral valve insufficiency who presents for evaluation of weakness and fatigue x 2 weeks. No focal complaints or findings on exam. No HA and neuro intact. Not on blood thinners. Plan for IVF, labs, UA,  CXR.   Clinical Course  Comment By Time  Patient reports that still feels very weak after IVF and electrolyte repletion. UA and CXR negative. Patient received her home clonidine and lisinopril and remains extremely hypertensive with BP 230/130. Will give IV hydralazine and consult hospitalist for admission. Nita Sicklearolina Jeremy Mclamb, MD 08/19 2018    Pertinent labs & imaging results that were available during my care of the patient were reviewed by me and considered in my medical decision making (see chart for details).    ____________________________________________   FINAL CLINICAL IMPRESSION(S) / ED DIAGNOSES  Final diagnoses:  Weakness  Hypokalemia  Hypomagnesemia  Hypertensive urgency, malignant      NEW MEDICATIONS STARTED DURING THIS VISIT:  New Prescriptions   MAGNESIUM CHLORIDE (SLOW-MAG) 64 MG TBEC SR TABLET    Take 1 tablet (64 mg total) by mouth daily.   POTASSIUM CHLORIDE (K-DUR) 10 MEQ TABLET    Take 1 tablet (10 mEq total) by mouth daily.     Note:  This document was prepared using Dragon voice recognition software and may include unintentional dictation errors.    Nita Sicklearolina Solace Manwarren, MD 10/11/15 2024

## 2015-10-11 NOTE — ED Notes (Signed)
Pt's niece states that she normally takes 20mg  Lisinopril at approx 2pm, has not had dose today.

## 2015-10-11 NOTE — Care Management Obs Status (Signed)
MEDICARE OBSERVATION STATUS NOTIFICATION   Patient Details  Name: Gabrielle Lyons MRN: [id number] Date of Birth: 06/20/1933   Medicare Observation Status Notification Given:   Yes; patient initialled due to weakness.    Berna Bueheryl Aithan Farrelly, RN 10/11/2015, 9:01 PM

## 2015-10-11 NOTE — ED Notes (Signed)
First call at 2132 = BP out of range, orders rx'd from [facility]

## 2015-10-11 NOTE — ED Notes (Signed)
Report given to Noel, RN 

## 2015-10-11 NOTE — ED Notes (Signed)
This RN to bedside. Medications administered. Pt placed on bedpan, removed from bedpan and warm blanket given. Pt given a straw and family told to encourage patient to finish drinking K+ powder drink. Pt calm and cooperative NAD noted at this time.

## 2015-10-11 NOTE — ED Notes (Signed)
This RN and Isaiah BlakesJerrie, RN at bedside to perform in and out cath.

## 2015-10-12 ENCOUNTER — Observation Stay: Payer: Medicare Other

## 2015-10-12 LAB — BASIC METABOLIC PANEL
Anion gap: 8 (ref 5–15)
BUN: 13 mg/dL (ref 6–20)
CHLORIDE: 108 mmol/L (ref 101–111)
CO2: 27 mmol/L (ref 22–32)
Calcium: 9 mg/dL (ref 8.9–10.3)
Creatinine, Ser: 0.6 mg/dL (ref 0.44–1.00)
Glucose, Bld: 105 mg/dL — ABNORMAL HIGH (ref 65–99)
POTASSIUM: 3.1 mmol/L — AB (ref 3.5–5.1)
SODIUM: 143 mmol/L (ref 135–145)

## 2015-10-12 LAB — CBC
HEMATOCRIT: 36.2 % (ref 35.0–47.0)
Hemoglobin: 12.3 g/dL (ref 12.0–16.0)
MCH: 29 pg (ref 26.0–34.0)
MCHC: 33.9 g/dL (ref 32.0–36.0)
MCV: 85.3 fL (ref 80.0–100.0)
PLATELETS: 209 10*3/uL (ref 150–440)
RBC: 4.24 MIL/uL (ref 3.80–5.20)
RDW: 18.5 % — ABNORMAL HIGH (ref 11.5–14.5)
WBC: 8.8 10*3/uL (ref 3.6–11.0)

## 2015-10-12 LAB — SALICYLATE LEVEL: Salicylate Lvl: <4 mg/dL (ref 2.8–30.0)

## 2015-10-12 LAB — TSH: TSH: 6.75 u[IU]/mL — AB (ref 0.350–4.500)

## 2015-10-12 LAB — ACETAMINOPHEN LEVEL

## 2015-10-12 LAB — T4, FREE: Free T4: 1.11 ng/dL (ref 0.61–1.12)

## 2015-10-12 LAB — TROPONIN I
Troponin I: 0.03 ng/mL (ref <0.03)
Troponin I: <0.03 ng/mL (ref <0.03)

## 2015-10-12 LAB — CK: CK TOTAL: 19 U/L — AB (ref 38–234)

## 2015-10-12 MED ORDER — HYDRALAZINE HCL 20 MG/ML IJ SOLN
10.0000 mg | Freq: Four times a day (QID) | INTRAMUSCULAR | Status: DC | PRN
  Administered 2015-10-12: 10 mg via INTRAVENOUS
  Filled 2015-10-12: qty 1

## 2015-10-12 MED ORDER — LABETALOL HCL 5 MG/ML IV SOLN
10.0000 mg | INTRAVENOUS | Status: DC | PRN
  Administered 2015-10-12: 10 mg via INTRAVENOUS
  Filled 2015-10-12: qty 4

## 2015-10-12 MED ORDER — MINOXIDIL 2.5 MG PO TABS
2.5000 mg | ORAL_TABLET | Freq: Two times a day (BID) | ORAL | Status: DC
  Administered 2015-10-12 – 2015-10-14 (×5): 2.5 mg via ORAL
  Filled 2015-10-12 (×8): qty 1

## 2015-10-12 MED ORDER — LISINOPRIL 20 MG PO TABS
40.0000 mg | ORAL_TABLET | Freq: Two times a day (BID) | ORAL | Status: DC
Start: 2015-10-12 — End: 2015-10-15
  Administered 2015-10-12 – 2015-10-15 (×6): 40 mg via ORAL
  Filled 2015-10-12 (×8): qty 2

## 2015-10-12 MED ORDER — HYDRALAZINE HCL 20 MG/ML IJ SOLN
10.0000 mg | Freq: Once | INTRAMUSCULAR | Status: AC
Start: 2015-10-12 — End: 2015-10-12
  Administered 2015-10-12: 10 mg via INTRAVENOUS
  Filled 2015-10-12: qty 1

## 2015-10-12 NOTE — Progress Notes (Signed)
[facility] [place] [facility]   PATIENT NAME: Gabrielle AlbeeLillie Gabrielle Lyons    MR#:  [id number]  DATE OF BIRTH:  01/17/1934  SUBJECTIVE:  CHIEF COMPLAINT:   Chief Complaint  Patient presents with  . Weakness  Patient is a 82 year old African-American female with past medical history significant for history of essential hypertension, hyperlipidemia, COPD, peripheral vascular disease, status post right AKA in March 2017 who presents to the hospital with complaints of increasing weakness and fatigue. No pains, fever were reported.on arrival to the hospital,the patient's labs revealed hypokalemia, hypomagnesemia, mild elevation of troponin, normal CBC. Patient's blood pressure was markedly elevated and she was admitted to the hospital for therapy. Patient's blood pressure medications were advanced, however. Blood pressure is not improving despite numerous medications tried.  Review of Systems  Constitutional: Negative for chills, fever and weight loss.  HENT: Negative for congestion.   Eyes: Negative for blurred vision and double vision.  Respiratory: Negative for cough, sputum production, shortness of breath and wheezing.   Cardiovascular: Negative for chest pain, palpitations, orthopnea, leg swelling and PND.  Gastrointestinal: Negative for abdominal pain, blood in stool, constipation, diarrhea, nausea and vomiting.  Genitourinary: Negative for dysuria, frequency, hematuria and urgency.  Musculoskeletal: Negative for falls.  Neurological: Negative for dizziness, tremors, focal weakness and headaches.  Endo/Heme/Allergies: Does not bruise/bleed easily.  Psychiatric/Behavioral: Negative for depression. The patient does not have insomnia.     VITAL SIGNS: Blood pressure (!) 192/109, pulse 71, temperature 98.4 F (36.9 C), temperature source Oral, resp. rate 16, height 5\' 6"  (1.676 m), weight 49.7 kg (109 lb 9.6 oz), SpO2 100 %.  PHYSICAL EXAMINATION:   GENERAL:  82  y.o.-year-old patient lying in the bed with no acute distress. Somnolent, but arousable, able to answer some questions appropriately EYES: Pupils equal, round, reactive to light and accommodation. No scleral icterus. Extraocular muscles intact.  HEENT: Head atraumatic, normocephalic. Oropharynx and nasopharynx clear.  NECK:  Supple, no jugular venous distention. No thyroid enlargement, no tenderness.  LUNGS: Normal breath sounds bilaterally, no wheezing, rales,rhonchi or crepitation. No use of accessory muscles of respiration.  CARDIOVASCULAR: S1, S2 normal. No murmurs, rubs, or gallops.  ABDOMEN: Soft, nontender, nondistended. Bowel sounds present. No organomegaly or mass.  EXTREMITIES: No pedal edema, cyanosis, or clubbing. Right AKA. Left lower extremity has coolness to touch in distal areas, black eschar due to necrosis and ulceration was noted on the tip of the great toe on the left. Peripheral pulses are not palpable, however obtainable via Doppler.  NEUROLOGIC: Cranial nerves II through XII are intact. Muscle strength 5/5 in all extremities. Sensation intact. Gait not checked.  PSYCHIATRIC: The patient is somnolent, however oriented x 3.  SKIN: No obvious rash, lesion, or ulcer.   ORDERS/RESULTS REVIEWED:   CBC  Recent Labs Lab 10/11/15 1727 10/12/15 0535  WBC 8.2 8.8  HGB 12.7 12.3  HCT 38.8 36.2  PLT 215 209  MCV 86.8 85.3  MCH 28.5 29.0  MCHC 32.8 33.9  RDW 18.7* 18.5*   ------------------------------------------------------------------------------------------------------------------  Chemistries   Recent Labs Lab 10/11/15 1727 10/12/15 0535  NA 143 143  K 3.0* 3.1*  CL 106 108  CO2 28 27  GLUCOSE 93 105*  BUN 14 13  CREATININE 0.68 0.60  CALCIUM 9.6 9.0  MG 1.5*  --   AST 18  --   ALT 10*  --   ALKPHOS 66  --   BILITOT 0.5  --    ------------------------------------------------------------------------------------------------------------------ estimated  creatinine clearance is 44 mL/min (by C-G formula based on SCr of 0.8 mg/dL). ------------------------------------------------------------------------------------------------------------------  Recent Labs  10/11/15 2332  TSH 6.750*    Cardiac Enzymes  Recent Labs Lab 10/11/15 2332 10/12/15 0535 10/12/15 1056  TROPONINI 0.03* <0.03 <0.03   ------------------------------------------------------------------------------------------------------------------ Invalid input(s): POCBNP ---------------------------------------------------------------------------------------------------------------  RADIOLOGY: Dg Chest 2 View  Result Date: 10/11/2015 CLINICAL DATA:  The last couple of days pt states she has been extremely weak and fatigue. Pt has an ulcer on the tip of her great toe on the left foot. EXAM: CHEST  2 VIEW COMPARISON:  None. FINDINGS: Hyperinflation. Patient rotated right on the frontal. Moderate cardiomegaly. Markedly tortuous, atherosclerotic thoracic aorta. No pleural effusion or pneumothorax. No congestive failure. Clear lungs. IMPRESSION: Cardiomegaly without congestive failure. Markedly tortuous, atherosclerotic thoracic aorta. Electronically Signed   By: Jeronimo GreavesKyle  Talbot M.D.   On: 10/11/2015 18:09   Dg Foot Complete Left  Result Date: 10/11/2015 CLINICAL DATA:  Left foot pain EXAM: LEFT FOOT - COMPLETE 3+ VIEW COMPARISON:  None. FINDINGS: Osteopenia.  No fracture or dislocation. IMPRESSION: Negative. Electronically Signed   By: Gerome Samavid  Williams III M.D   On: 10/11/2015 18:10    EKG:  Orders placed or performed during the hospital encounter of 10/11/15  . ED EKG  . ED EKG    ASSESSMENT AND PLAN:  Active Problems:   Hypertensive urgency #1. Malignant essential hypertension, continue clonidine and Norvasc, adding minoxidil, advancing lisinopril,follow blood pressure readings closely, initiate labetalol as needed intravenously #2. Altered mental status/Somnolence,  get MRI  of the brain to rule out stroke, although neurologically seems to be intact.  #3 hypokalemia, supplement orally, supplement magnesium as well. Follow potassium and magnesium levels in the morning #4. Elevated troponin, likely demand ischemia, continue aspirin, labetalol,Pravachol, no interventions #5. Gen. Decline, discussed with patient's sister, who is power of attorney for medical care, get palliative care input, patient's sister would not want patient to be placed due to his home she requests help. She would receive from the hospital including care providers at home, care management is consulted   Management plans discussed with the patient, family and they are in agreement.   DRUG ALLERGIES:  Allergies  Allergen Reactions  . Sulfa Antibiotics Hives    CODE STATUS:     Code Status Orders        Start     Ordered   10/11/15 2311  Full code  Continuous     10/11/15 2310    Code Status History    Date Active Date Inactive Code Status Order ID Comments User Context   This patient has a current code status but no historical code status.      TOTAL critical careTIME TAKING CARE OF THIS PATIENT: 45 minutes.    Katharina CaperVAICKUTE,Jovie Swanner M.D on 10/12/2015 at 12:55 PM  Between 7am to 6pm - Pager - [phone]  After 6pm go to [email or web address] - password [email or web address] [facility]  [facility]  Office  [phone]  CC: Primary care physician; [facility] Center[facility]ELDPAUSCH, Madaline GuthrieALE E, MD

## 2015-10-12 NOTE — Progress Notes (Signed)
Pt received dose of IV hydralazine around 0650 this AM. Bp recheck at 0807 showed BP still elevated at 188/102. Dr. Winona LegatoVaickute notified and orders to give an increased dose of lisinopril now, as well as PRN orders for PRN IV labetalol were received. PO lisinopril given, will recheck BP on next rounds.

## 2015-10-12 NOTE — Evaluation (Signed)
Physical Therapy Evaluation Patient Details Name: Gabrielle Lyons MRN: [id number] DOB: 03/26/1933 Today's Date: 10/12/2015   History of Present Illness  Patient is a 82 year old African-American female with past medical history significant for history of essential hypertension, hyperlipidemia, COPD, peripheral vascular disease, status post right AKA in March 2017 who presents to the hospital with complaints of increasing weakness and fatigue.   Clinical Impression  82 yo Female came to ED with increased weakness. Patient is s/p RLE AKA in March 2017. Prior to admittance, patient was getting home health PT and was working on scoot transfers with family. Her sister reports that over last 2 2 weeks she has been requiring increased assistance with mobility. Currently patient is min A for bed mobility with bed rails; She is mod A for scoot transfer without sliding board. Patient reports that she never used any assistive device but rather would just scoot from wheelchair to/from bedside commode etc. She demonstrates increased weakness in LLE. Patient also demonstrates impaired balance with increased posterior lean. She would benefit from additional skilled PT intervention to improve balance and transfer ability to return to PLOF and reduce caregiver burden. Patient's sister is older and reports that it is difficult to help her move and transfer.     Follow Up Recommendations SNF    Equipment Recommendations  None recommended by PT    Recommendations for Other Services       Precautions / Restrictions Precautions Precautions: Fall Restrictions Weight Bearing Restrictions: No (RLE is NWB due to AKA)      Mobility  Bed Mobility Overal bed mobility: Needs Assistance Bed Mobility: Supine to Sit     Supine to sit: Min assist     General bed mobility comments: patient able to initiate movement, requires elevated head of bed, requires cues to pull on bedrails for better mobility;    Transfers Overall transfer level: Needs assistance Equipment used: None Transfers: Lateral/Scoot Transfers          Lateral/Scoot Transfers: From elevated surface;Mod assist General transfer comment: patient able to initiate transfer; does requires cues for hand position; she does require mod A for increased movement of transfer as patient is very slow and fatigues quickly;   Ambulation/Gait             General Gait Details: unable   Stairs            Wheelchair Mobility    Modified Rankin (Stroke Patients Only)       Balance Overall balance assessment: Needs assistance Sitting-balance support: Bilateral upper extremity supported;Feet supported Sitting balance-Leahy Scale: Fair Sitting balance - Comments: requires min A for keeping balance in sitting as patient demonstrates posterior lean;  Postural control: Posterior lean     Standing balance comment: unable to stand at this time;                              Pertinent Vitals/Pain Pain Assessment: No/denies pain    Home Living Family/patient expects to be discharged to:: Private residence Living Arrangements:  (sister) Available Help at Discharge: Family Type of Home: House Home Access: Ramped entrance     Home Layout: One level Home Equipment: Wheelchair - manual;Bedside commode      Prior Function Level of Independence: Needs assistance   Gait / Transfers Assistance Needed: non ambulatory  ADL's / Homemaking Assistance Needed: sister would assist with transfers from wheelchair<>bed/commode. Needs help with most ADLs  Comments: reports  having trouble with transfers over last 2 weeks     Hand Dominance        Extremity/Trunk Assessment   Upper Extremity Assessment: Generalized weakness           Lower Extremity Assessment: LLE deficits/detail;RLE deficits/detail RLE Deficits / Details: not assessed as patient is non-weight bearing through RLE (AKA) LLE Deficits /  Details: grossly 3/5  Cervical / Trunk Assessment: Kyphotic  Communication   Communication: No difficulties  Cognition Arousal/Alertness: Lethargic Behavior During Therapy: WFL for tasks assessed/performed Overall Cognitive Status: Within Functional Limits for tasks assessed       Memory: Decreased short-term memory              General Comments General comments (skin integrity, edema, etc.): does have escar on left great toe;     Exercises Other Exercises Other Exercises: Instructed patient in LLE strengthening: long sitting: ankle pumps x10 with cues to slow down and increase ROM; LLE SLR attempted (not able); Short arc quad x10; patient able to demonstrate understanding and use teachback for better learning (x8 min)      Assessment/Plan    PT Assessment Patient needs continued PT services  PT Diagnosis Generalized weakness   PT Problem List Decreased strength;Decreased activity tolerance;Decreased balance;Decreased safety awareness;Decreased mobility  PT Treatment Interventions DME instruction;Functional mobility training;Balance training;Therapeutic exercise;Therapeutic activities;Patient/family education;Wheelchair mobility training   PT Goals (Current goals can be found in the Care Plan section) Acute Rehab PT Goals Patient Stated Goal: "I want to be stronger." PT Goal Formulation: With patient Time For Goal Achievement: 10/26/15 Potential to Achieve Goals: Fair    Frequency Min 2X/week   Barriers to discharge Decreased caregiver support sister is older and does have trouble helping patient transfer;     Co-evaluation               End of Session Equipment Utilized During Treatment: Gait belt Activity Tolerance: Patient tolerated treatment well Patient left: in chair;with call bell/phone within reach;with family/visitor present;with chair alarm set Nurse Communication: Mobility status    Functional Assessment Tool Used: clinical judgement/strength  and mobility Functional Limitation: Mobility: Walking and moving around Mobility: Walking and Moving Around Current Status ([id number]): At least 80 percent but less than 100 percent impaired, limited or restricted Mobility: Walking and Moving Around Goal Status [phone]([id number]): At least 60 percent but less than 80 percent impaired, limited or restricted    Time: 1416-1455 PT Time Calculation (min) (ACUTE ONLY): 39 min   Charges:   PT Evaluation $PT Eval Moderate Complexity: 1 Procedure PT Treatments $Therapeutic Exercise: 8-22 mins   PT G Codes:   PT G-Codes **NOT FOR INPATIENT CLASS** Functional Assessment Tool Used: clinical judgement/strength and mobility Functional Limitation: Mobility: Walking and moving around Mobility: Walking and Moving Around Current Status ([id number]): At least 80 percent but less than 100 percent impaired, limited or restricted Mobility: Walking and Moving Around Goal Status [phone]([id number]): At least 60 percent but less than 80 percent impaired, limited or restricted    Trotter,Margaret PT, DPT 10/12/2015, 3:06 PM

## 2015-10-12 NOTE — Progress Notes (Addendum)
Pt has had elevated blood pressure despite increasing dose of lisinopril and receiving IV labetalol. Last BP 192/109 (130), Dr. Winona LegatoVaickute notified. Per MD order, will recheck BP in one hour as pt just received her remaining PO BP meds,and then if still elevated will notify MD. Pt denies h/a, CP, SOB, dizziness, lightheadedness, and remains oriented. In addition pt has longstanding vascular health issues. She has a R AKA and an ulcer to her R great toe. Pulses are very weak and her R dorsalis pedis and R posterior tibial pulses are unpalpable. Both can be found using the doppler. Dr. Winona LegatoVaickute is aware and noted that pt is being seen chronically by vascular surgery outpatient. In addition patient's wound is being treated outpatient as well. Dr. Winona LegatoVaickute asked me to inquire about which type of cream/medication her wound is being treated with outpatient so that it can be added to patient's med list here in hospital. Pt's sister is trying to find out the medication name. Will page Dr. Winona LegatoVaickute with name of medication once sister is able to disclose this.

## 2015-10-12 NOTE — Progress Notes (Signed)
Pt still hypertensive, last BP reading 194/103. MD Hugelmeyer notified. Verbal order given for 10 mg hydralazine once. RN to place orders and administer.  Gabrielle Lyons, Gabrielle Lyons

## 2015-10-12 NOTE — Care Management Obs Status (Signed)
MEDICARE OBSERVATION STATUS NOTIFICATION   Patient Details  Name: Gabrielle Lyons MRN: [id number] Date of Birth: 12/15/1933   Medicare Observation Status Notification Given:  Yes (sister [name] signed)    Caren MacadamMichelle Debi Cousin, RN 10/12/2015, 7:07 PM

## 2015-10-12 NOTE — Progress Notes (Signed)
After receiving additional PO medications pt's BP continues to be elevated. MD updated via text-page system. New orders for BP medication has been entered into Epic.

## 2015-10-12 NOTE — Progress Notes (Addendum)
Pt has an MRI ordered for today. [facility] and [facility], she told me the MRI technician has been called in to complete MRIs and that she would inform the tech that Ms. Christella HartiganJacobs needs the test to be done. Receiving RN Darl Pikes(Susan) is aware that Ms. Christella HartiganJacobs is ordered to have MRI today. Also, pt's sister has not yet been able to locate the name of the medication that is being used on the patient's toe. Darl PikesSusan, RN is aware and knows to call MD with the name of this medication once sister locates name of medication. In addition, pt is to have a palliative care consult, per unit secretary the order will be sent over to palliative care.

## 2015-10-12 NOTE — Plan of Care (Signed)
Problem: Tissue Perfusion: Goal: Risk factors for ineffective tissue perfusion will decrease Outcome: Progressing Encourage feeding and movement in the bed

## 2015-10-13 LAB — URINE CULTURE: Culture: NO GROWTH

## 2015-10-13 LAB — POTASSIUM: POTASSIUM: 3.4 mmol/L — AB (ref 3.5–5.1)

## 2015-10-13 LAB — MAGNESIUM: Magnesium: 1.9 mg/dL (ref 1.7–2.4)

## 2015-10-13 MED ORDER — DRONABINOL 2.5 MG PO CAPS: 2.5000 mg | ORAL_CAPSULE | Freq: Two times a day (BID) | ORAL | Status: DC

## 2015-10-13 MED ORDER — DULOXETINE HCL 30 MG PO CPEP
30.0000 mg | ORAL_CAPSULE | Freq: Every day | ORAL | Status: DC
  Administered 2015-10-14: 30 mg via ORAL
  Filled 2015-10-13 (×2): qty 1

## 2015-10-13 MED ORDER — POTASSIUM CHLORIDE CRYS ER 20 MEQ PO TBCR: 40.0000 meq | EXTENDED_RELEASE_TABLET | Freq: Once | ORAL | Status: DC

## 2015-10-13 MED ORDER — DRONABINOL 2.5 MG PO CAPS
2.5000 mg | ORAL_CAPSULE | Freq: Two times a day (BID) | ORAL | Status: DC
  Administered 2015-10-13: 2.5 mg via ORAL
  Filled 2015-10-13: qty 1

## 2015-10-13 NOTE — Progress Notes (Signed)
Gabrielle Lyons    MR#:  [id number]  DATE OF BIRTH:  11/25/1933  SUBJECTIVE:  CHIEF COMPLAINT:   Chief Complaint  Patient presents with  . Weakness  Patient is a 82 year old African-American female with past medical history significant for history of essential hypertension, hyperlipidemia, COPD, peripheral vascular disease, status post right AKA in March 2017 who presents to the hospital with complaints of increasing weakness and fatigue. No pains, fever were reported.on arrival to the hospital,the patient's labs revealed hypokalemia, hypomagnesemia, mild elevation of troponin, normal CBC. Patient's blood pressure was markedly elevated and she was admitted to the hospital for therapy. Patient's blood pressure medications were advanced, however. Blood pressureHas improved on minoxidil. Patient's brain MRI was unremarkable, but atrophy. Patient denies any pain, remains somnolent, intermittently, per patient's sister, who is present during my interview and evaluation.      Review of Systems  Constitutional: Negative for chills, fever and weight loss.  HENT: Negative for congestion.   Eyes: Negative for blurred vision and double vision.  Respiratory: Negative for cough, sputum production, shortness of breath and wheezing.   Cardiovascular: Negative for chest pain, palpitations, orthopnea, leg swelling and PND.  Gastrointestinal: Negative for abdominal pain, blood in stool, constipation, diarrhea, nausea and vomiting.  Genitourinary: Negative for dysuria, frequency, hematuria and urgency.  Musculoskeletal: Negative for falls.  Neurological: Negative for dizziness, tremors, focal weakness and headaches.  Endo/Heme/Allergies: Does not bruise/bleed easily.  Psychiatric/Behavioral: Negative for depression. The patient does not have insomnia.     VITAL SIGNS: Blood pressure 116/79, pulse 80, temperature 98.2 F (36.8 C),  temperature source Oral, resp. rate 16, height 5\' 6"  (1.676 m), weight 49.7 kg (109 lb 9.6 oz), SpO2 100 %.  PHYSICAL EXAMINATION:   GENERAL:  82 y.o.-year-old patient lying in the bed with no acute distress. Somnolent, but arousable, able to answer some questions appropriately EYES: Pupils equal, round, reactive to light and accommodation. No scleral icterus. Extraocular muscles intact.  HEENT: Head atraumatic, normocephalic. Oropharynx and nasopharynx clear.  NECK:  Supple, no jugular venous distention. No thyroid enlargement, no tenderness.  LUNGS: Normal breath sounds bilaterally, no wheezing, rales,rhonchi or crepitation. No use of accessory muscles of respiration.  CARDIOVASCULAR: S1, S2 normal. No murmurs, rubs, or gallops.  ABDOMEN: Soft, nontender, nondistended. Bowel sounds present. No organomegaly or mass.  EXTREMITIES: No pedal edema, cyanosis, or clubbing. Right AKA. Left lower extremity has coolness to touch in distal areas, black eschar due to necrosis and ulceration was noted on the tip of the great toe on the left. Peripheral pulses are not palpable, however obtainable via Doppler.  NEUROLOGIC: Cranial nerves II through XII are intact. Muscle strength 3/5 in all extremities. Sensation intact. Gait not checked.  PSYCHIATRIC: The patient is somnolent, however oriented x 2.  SKIN: No obvious rash, lesion, or ulcer.   ORDERS/RESULTS REVIEWED:   CBC  Recent Labs Lab 10/11/15 1727 10/12/15 0535  WBC 8.2 8.8  HGB 12.7 12.3  HCT 38.8 36.2  PLT 215 209  MCV 86.8 85.3  MCH 28.5 29.0  MCHC 32.8 33.9  RDW 18.7* 18.5*   ------------------------------------------------------------------------------------------------------------------  Chemistries   Recent Labs Lab 10/11/15 1727 10/12/15 0535  NA 143 143  K 3.0* 3.1*  CL 106 108  CO2 28 27  GLUCOSE 93 105*  BUN 14 13  CREATININE 0.68 0.60  CALCIUM 9.6 9.0  MG 1.5*  --   AST 18  --  ALT 10*  --   ALKPHOS 66  --    BILITOT 0.5  --    ------------------------------------------------------------------------------------------------------------------ estimated creatinine clearance is 44 mL/min (by C-G formula based on SCr of 0.8 mg/dL). ------------------------------------------------------------------------------------------------------------------  Recent Labs  10/11/15 2332  TSH 6.750*    Cardiac Enzymes  Recent Labs Lab 10/11/15 2332 10/12/15 0535 10/12/15 1056  TROPONINI 0.03* <0.03 <0.03   ------------------------------------------------------------------------------------------------------------------ Invalid input(s): POCBNP ---------------------------------------------------------------------------------------------------------------  RADIOLOGY: Dg Chest 2 View  Result Date: 10/11/2015 CLINICAL DATA:  The last couple of days pt states she has been extremely weak and fatigue. Pt has an ulcer on the tip of her great toe on the left foot. EXAM: CHEST  2 VIEW COMPARISON:  None. FINDINGS: Hyperinflation. Patient rotated right on the frontal. Moderate cardiomegaly. Markedly tortuous, atherosclerotic thoracic aorta. No pleural effusion or pneumothorax. No congestive failure. Clear lungs. IMPRESSION: Cardiomegaly without congestive failure. Markedly tortuous, atherosclerotic thoracic aorta. Electronically Signed   By: Jeronimo GreavesKyle  Talbot M.D.   On: 10/11/2015 18:09   Dg Foot Complete Left  Result Date: 10/11/2015 CLINICAL DATA:  Left foot pain EXAM: LEFT FOOT - COMPLETE 3+ VIEW COMPARISON:  None. FINDINGS: Osteopenia.  No fracture or dislocation. IMPRESSION: Negative. Electronically Signed   By: Gerome Samavid  Williams III M.D   On: 10/11/2015 18:10   Mr Brain Ltd W/o Cm  Result Date: 10/12/2015 CLINICAL DATA:  82 year old female with weakness and elevated blood pressure. Initial encounter. EXAM: MRI HEAD WITHOUT CONTRAST TECHNIQUE: Multiplanar, multiecho pulse sequences of the brain and surrounding  structures were obtained without intravenous contrast. COMPARISON:  Head CT without contrast 01/11/2015. FINDINGS: No restricted diffusion to suggest acute infarction. No midline shift, mass effect, evidence of mass lesion, ventriculomegaly, extra-axial collection or acute intracranial hemorrhage. Cervicomedullary junction and pituitary are within normal limits. Major intracranial vascular flow voids are preserved, with generalized intracranial artery dolichoectasia. Fairly numerous scattered chronic micro hemorrhages in the brain, including in the right basal ganglia, bilateral thalami, and brainstem. However, he extent is not to the degree to typically seen with amyloid angiopathy. Superimposed Patchy and confluent nonspecific cerebral white matter T2 and FLAIR hyperintensity. Deep white matter capsule involvement. Superimposed T2 heterogeneity throughout the deep gray matter nuclei suggesting chronic lacunar infarcts. Lesser involvement of the pons. No cortical encephalomalacia identified. Visible internal auditory structures appear normal. Mastoids are clear. Mild paranasal sinus mucosal thickening and right maxillary sinus mucous retention cyst. Postoperative changes to the left globe. Otherwise negative orbit and scalp soft tissues. Normal bone marrow signal. Widespread cervical spine degeneration. IMPRESSION: 1.  No acute intracranial abnormality. 2. Signal changes most compatible with advanced chronic small vessel disease. Amyloid angiopathy, as an explanation for the multiple chronic micro-hemorrhages, is felt less likely. Electronically Signed   By: Odessa FlemingH  Hall M.D.   On: 10/12/2015 17:11    EKG:  Orders placed or performed during the hospital encounter of 10/11/15  . ED EKG  . ED EKG    ASSESSMENT AND PLAN:  Active Problems:   Hypertensive urgency #1. Malignant essential hypertension, continue clonidine and Norvasc, minoxidil, lisinopril, blood pressure Has improved, continue labetalol as needed  intravenously #2. Altered mental status/Somnolence,  MRI of the brain showed no stroke, but atrophy, suspect decline due to dementia, although neurologically seems to be intact, palliative care is consulted, recommendations are pending, patient may benefit from palliative care follow up as outpatient.  #3 hypokalemia, supplement orally, supplement magnesium as well. Follow potassium and magnesium levels today  #4. Elevated troponin, likely demand ischemia, continue  aspirin, labetalol, Pravachol, no interventions #5. Generalized weakness, physical and mental decline, discussed with patient's sister again today, awaiting for palliative care input, patient's sister did not want patient to be placed , but patient does not eat much, she may be a candidate for hospice home placement. #6 failure to thrive, adult, initiate Marinol .    Management plans discussed with the patient, family and they are in agreement.   DRUG ALLERGIES:  Allergies  Allergen Reactions  . Sulfa Antibiotics Hives    CODE STATUS:     Code Status Orders        Start     Ordered   10/11/15 2311  Full code  Continuous     10/11/15 2310    Code Status History    Date Active Date Inactive Code Status Order ID Comments User Context   This patient has a current code status but no historical code status.      TOTAL TIME TAKING CARE OF THIS PATIENT: 35 minutes.    Katharina Caper M.D on 10/13/2015 at 12:38 PM  Between 7am to 6pm - Pager - [phone]  After 6pm go to [email or web address] - password [email or web address] [facility]  [place] [facility]  Office  [phone]  CC: Primary care physician; [facility], Madaline Guthrie, MD

## 2015-10-13 NOTE — Progress Notes (Signed)
PT Cancellation Note  Patient Details Name: Gabrielle Lyons MRN: [id number] DOB: 10/08/1933   Cancelled Treatment:    Reason Eval/Treat Not Completed: Medical issues which prohibited therapy   Chart reviewed.  Potassium 3.1.  Pt non-verbal upon entering room.  Pt would look at writer but close her eyes and turn away.  Family member in room voicing concerns "She's not doing good"  Discussed with primary nurse.  Session held.  Danielle DessSarah Faylinn Schwenn 10/13/2015, 12:57 PM

## 2015-10-13 NOTE — Care Management (Signed)
Attempted to call sister multiple times and her line was busy. It is reported that patient lives at home with her sister. PT recommending SNF. CSW following.

## 2015-10-14 DIAGNOSIS — R531 Weakness: Secondary | ICD-10-CM

## 2015-10-14 DIAGNOSIS — Z515 Encounter for palliative care: Secondary | ICD-10-CM

## 2015-10-14 DIAGNOSIS — R627 Adult failure to thrive: Secondary | ICD-10-CM | POA: Diagnosis not present

## 2015-10-14 DIAGNOSIS — Z789 Other specified health status: Secondary | ICD-10-CM | POA: Diagnosis not present

## 2015-10-14 DIAGNOSIS — Z66 Do not resuscitate: Secondary | ICD-10-CM

## 2015-10-14 MED ORDER — ACETAMINOPHEN 325 MG PO TABS: 650.0000 mg | ORAL_TABLET | Freq: Four times a day (QID) | ORAL | Status: DC | PRN

## 2015-10-14 MED ORDER — ACETAMINOPHEN 325 MG PO TABS
650.0000 mg | ORAL_TABLET | Freq: Four times a day (QID) | ORAL | Status: DC | PRN
  Filled 2015-10-14: qty 1
  Filled 2015-10-14: qty 2

## 2015-10-14 MED ORDER — MORPHINE SULFATE (CONCENTRATE) 10 MG/0.5ML PO SOLN: 5.0000 mg | ORAL | Status: DC | PRN

## 2015-10-14 MED ORDER — LORAZEPAM 2 MG/ML IJ SOLN
1.0000 mg | INTRAMUSCULAR | Status: DC | PRN
  Administered 2015-10-15: 1 mg via INTRAVENOUS
  Filled 2015-10-14: qty 1

## 2015-10-14 MED ORDER — LORAZEPAM 1 MG PO TABS: 1.0000 mg | ORAL_TABLET | ORAL | Status: DC | PRN

## 2015-10-14 NOTE — Progress Notes (Signed)
Patient is agitated and confused. Patient is hitting at staff and family member at the bedside. Patient is being uncooperative. Notified Physician that staff is unable to administer oral ativan in patient present condition.

## 2015-10-14 NOTE — Progress Notes (Signed)
PT Cancellation Note  Patient Details Name: Gabrielle Lyons MRN: [id number] DOB: 12/02/1933   Cancelled Treatment:    Reason Eval/Treat Not Completed: Patient declined, no reason specified. Spoke with nursing prior to attempting treatment, as pt was a hold yesterday and is now planning on discharging home to hospice at a later date. Nursing offers to ask pt what her preference/comfort level is with PT participation. Pt pleasantly refuses PT. Will speak with primary PT regarding completing PT orders at this time.    Elsie StainHeidi Elizabeth FultonBishop, VirginiaPTA 10/14/2015, 4:16 PM

## 2015-10-14 NOTE — Care Management (Signed)
Informed by palliative care that family is wishing for patient to return home with hospice service and not go to snf.  CM provided written list of agencies and [facility].  Referral to  nurse liaison.  Patient will need a hospital bed.  Will travel home by non emergent ems transport

## 2015-10-14 NOTE — Consult Note (Addendum)
WOC Nurse wound consult note Reason for Consult: Left toe eshcar Wound type: Unstageable left great toe eschar secondary to peripheral vascular disease Pressure Ulcer POA: Yes Measurement: 1.2 x 1.2 x 0 Wound bed: Black, soft, stable eshcar Drainage (amount, consistency, odor) no drainage, no odor Periwound: intact, soft, cool skin Dressing procedure/placement/frequency: Per family, pt is seen by podiatrist who has prescribed an ointment for the toe.  Recommend dry dressing as eschar is stable at this time. Cover left toe with Band-Aid to protect. Change prn. Once ointment is know, then follow instructions per podiatry.  Discussed POC with patient and bedside nurse.  Re consult if needed, will not follow at this time. Thanks Henriette CombsSarah Estellar Cadena BSN, RN, [facility] Center[facility]OCN

## 2015-10-14 NOTE — Consult Note (Signed)
Consultation Note Date: 10/14/2015   Patient Name: Gabrielle Lyons  DOB: 12/07/1933  MRN: [id number]  Age / Sex: 82 y.o., female  PCP: Gabrielle Goodellale E Feldpausch, MD Referring Physician: Katharina Caperima Vaickute, MD  Reason for Consultation: Establishing goals of care, Non pain symptom management, Pain control and Psychosocial/spiritual support  HPI/Patient Profile:   82 y.o. female  admitted on 10/11/2015 with  known history of Hypertension, hyperlipidemia, COPD, peripheral vascular disease status post right AKA in March/2016 with reported continued physical, functional and cognitive declinesince the amputation, per family.    Reportedly she continues to have pain in the amputated limb.  They "wish they never did it"  She was at her baseline state of health until the past 2 weeks when her family reports that she has been increasingly weak and fatigued.  The most significant change for her the fact that she was unable to transfer from wheelchair for the past 2 days and has been increasingly somnolent.   Admitted through the ER  Family faces advanced directive decisions and anticipatory care needs.   Clinical Assessment and Goals of Care:  This NP Gabrielle Lyons reviewed medical records, received report from team, assessed the patient and then meet at the patient's bedside along with her sister Gabrielle Lyons/main decision maker and niece Gabrielle Lyons  to discuss diagnosis, prognosis, GOC, EOL wishes disposition and options.  A detailed discussion was had today regarding advanced directives.  Concepts specific to code status, artifical feeding and hydration, continued IV antibiotics and rehospitalization was had.  The difference between a aggressive medical intervention path  and a palliative comfort care path for this patient at this time was had.  Values and goals of care important to patient and family were attempted to be  elicited.  Concept of Hospice and Palliative Care were discussed  Natural trajectory and expectations at EOL were discussed.  Questions and concerns addressed.   Family encouraged to call with questions or concerns.  PMT will continue to support holistically.   SUMMARY OF RECOMMENDATIONS    -shift to a plan of care focused on comfort at home with hospice services  Code Status/Advance Care Planning  DNR-documented today    Symptom Management:   Pain/Dyspnea: Roxanol 5 mg po/sl every 1 hr prn  Anxiety: Ativan 1 mg po/sl every 4 hrs prn  Palliative Prophylaxis:   Aspiration, Bowel Regimen, Delirium Protocol, Frequent Pain Assessment, Oral Care and Palliative Wound Care  Additional Recommendations (Limitations, Scope, Preferences):  Avoid rehospitalizations, no artificial feeding or hydration, medications to maximize symptoms and enhance comfort  Psycho-social/Spiritual:    Desire for further Chaplaincy support:yes  Additional Recommendations: Education on Hospice  Prognosis:   -less than 6 months   Discharge Planning: Home with Hospice      Primary Diagnoses: Present on Admission: . Hypertensive urgency   I have reviewed the medical record, interviewed the patient and family, and examined the patient. The following aspects are pertinent.  Past Medical History:  Diagnosis Date  . Abdominal aneurysm (HCC)  also, "poor circulation" - legs  . COPD (chronic obstructive pulmonary disease) (HCC)   . GERD (gastroesophageal reflux disease)   . H/O blood clots    legs  . Hypercholesteremia   . Hypertension   . Mitral valve insufficiency   . Shortness of breath dyspnea    with exertion   Social History   Social History  . Marital status: Single    Spouse name: N/A  . Number of children: N/A  . Years of education: N/A   Social History Main Topics  . Smoking status: Former Smoker    Quit date: 10/23/2013  . Smokeless tobacco: Current User    Types:  Snuff  . Alcohol use No  . Drug use: No  . Sexual activity: Not Asked   Other Topics Concern  . None   Social History Narrative  . None   History reviewed. No pertinent family history. Scheduled Meds: . amLODipine  10 mg Oral Daily  . aspirin EC  81 mg Oral Daily  . cloNIDine  0.3 mg Oral BID  . docusate sodium  100 mg Oral BID  . dronabinol  2.5 mg Oral BID AC  . DULoxetine  30 mg Oral Daily  . enoxaparin (LOVENOX) injection  40 mg Subcutaneous Q24H  . lisinopril  40 mg Oral BID  . minoxidil  2.5 mg Oral BID  . naproxen  250 mg Oral BID WC  . pantoprazole  40 mg Oral Daily  . potassium chloride  40 mEq Oral Once  . pravastatin  10 mg Oral Daily  . sodium chloride flush  3 mL Intravenous Q12H  . Vitamin D (Ergocalciferol)  50,000 Units Oral Q7 days   Continuous Infusions:  PRN Meds:.bisacodyl, calcium carbonate, HYDROcodone-acetaminophen, labetalol, magnesium citrate, ondansetron **OR** ondansetron (ZOFRAN) IV, oxyCODONE, senna-docusate, zolpidem Medications Prior to Admission:  Prior to Admission medications   Medication Sig Start Date End Date Taking? Authorizing Provider  acetaminophen (TYLENOL) 500 MG tablet Take 1,000 mg by mouth every 8 (eight) hours as needed.   Yes Historical Provider, MD  aspirin EC 81 MG tablet Take 81 mg by mouth every morning.   Yes Historical Provider, MD  cloNIDine (CATAPRES) 0.3 MG tablet Take 0.3 mg by mouth 2 (two) times daily.   Yes Historical Provider, MD  DULoxetine (CYMBALTA) 30 MG capsule Take 30 mg by mouth every morning.  09/08/15  Yes Historical Provider, MD  lisinopril (PRINIVIL,ZESTRIL) 20 MG tablet Take 20 mg by mouth every evening.  06/27/15  Yes Historical Provider, MD  pravastatin (PRAVACHOL) 10 MG tablet Take 10 mg by mouth every evening. PM    Yes Historical Provider, MD  clopidogrel (PLAVIX) 75 MG tablet Take 1 tablet (75 mg total) by mouth daily. Patient not taking: Reported on 07/07/2015 05/12/15   Annice NeedyJason S Dew, MD   HYDROcodone-acetaminophen (NORCO) 5-325 MG tablet Take 1 tablet by mouth every 6 (six) hours as needed for moderate pain. Patient not taking: Reported on 10/11/2015 05/12/15   Annice NeedyJason S Dew, MD  magnesium chloride (SLOW-MAG) 64 MG TBEC SR tablet Take 1 tablet (64 mg total) by mouth daily. 10/11/15   Nita Sicklearolina Veronese, MD  potassium chloride (K-DUR) 10 MEQ tablet Take 1 tablet (10 mEq total) by mouth daily. 10/11/15   Nita Sicklearolina Veronese, MD   Allergies  Allergen Reactions  . Sulfa Antibiotics Hives   Review of Systems  Unable to perform ROS: Dementia    Physical Exam  Constitutional: She appears cachectic. She appears ill.  Cardiovascular: Normal  rate, regular rhythm and normal heart sounds.   Pulmonary/Chest: She has decreased breath sounds in the right lower field and the left lower field.  Abdominal: Soft. Bowel sounds are normal.  Musculoskeletal:  -noted right AKA  Neurological: She is alert.  Skin: Skin is warm and dry.    Vital Signs: BP (!) 146/81   Pulse 73   Temp 98.7 F (37.1 C) (Oral)   Resp 18   Ht 5\' 6"  (1.676 m)   Wt 49.7 kg (109 lb 9.6 oz)   SpO2 99%   BMI 17.69 kg/m  Pain Assessment: Faces POSS *See Group Information*: S-Acceptable,Sleep, easy to arouse Pain Score: 0-No pain   SpO2: SpO2: 99 % O2 Device:SpO2: 99 % O2 Flow Rate: .   IO: Intake/output summary:  Intake/Output Summary (Last 24 hours) at 10/14/15 1200 Last data filed at 10/14/15 0957  Gross per 24 hour  Intake              120 ml  Output                0 ml  Net              120 ml    LBM: Last BM Date: 10/13/15 Baseline Weight: Weight: 54.4 kg (120 lb) Most recent weight: Weight: 49.7 kg (109 lb 9.6 oz)      Palliative Assessment/Data:  30% at best   Flowsheet Rows   Flowsheet Row Most Recent Value  Intake Tab  Referral Department  Hospitalist  Unit at Time of Referral  Cardiac/Telemetry Unit  Palliative Care Primary Diagnosis  Pulmonary  Date Notified  10/12/15  Palliative  Care Type  New Palliative care  Reason for referral  Clarify Goals of Care  Date of Admission  10/11/15  # of days IP prior to Palliative referral  1  Clinical Assessment  Psychosocial & Spiritual Assessment  Palliative Care Outcomes     Discussed with Dr Winona Legato  Time In: 1100 Time Out: 1215 Time Total: 75 min Greater than 50%  of this time was spent counseling and coordinating care related to the above assessment and plan.  Signed by: Gabrielle Creed, NP   Please contact Palliative Medicine Team phone at [phone] for questions and concerns.  For individual provider: See Loretha Stapler

## 2015-10-14 NOTE — Progress Notes (Addendum)
New referral for [facility] services at home following discharge received from [facility]. Gabrielle Lyons is and 82 year old woman with a known history of HTN, PVD, s/p R AKA 04/2015, Mitral valve insufficiency, HLD, GERD and COPD admitted to [facility] on 8/19 for evaluation of weakness, poor appetite and elevated blood pressure. She has had adjustments to her blood pressure medications but has remained lethargic/weak and is eating only bites. Family met with Palliative Medicine NP Wadie Lessen to discuss goals of care today and have chosen to focus on Gabrielle Lyons comfort at home with the support of hospice services. Writer met in the room with Gabrielle Lyons, her sister Laveda Abbe, niece Margarita Rana and nephew Wilber Oliphant to initiate education regarding hospice services, philosophy and team approach to care with understanding voiced. Patient was alert, but drowsy and confused. She did recognize family, but was unable to carry on a conversation.DME requested includes a hospital bed and BSC. These have been ordered for delivery tomorrow 8/23 prior to 12 noon.   Please note: patient lives with her sister at [address] [place], [place] address on face sheet in incorrect. CMRN Joni Reining made aware. Plan is for discharge tomorrow 8/23 after DME is delivered. Family has requested EMS transport, [place] aware. Patient will need signed portable DNR in discharge. packet.  Patient information faxed to hospice referral. Will continue to follow through discharge. Thank you. Flo Shanks RN, BSN [facility] [place], hospital Liaison [phone] c

## 2015-10-14 NOTE — Clinical Social Work Note (Signed)
MSW received referral for SNF.  Case discussed with case manager and plan is to discharge home with hospice.  MSW to sign off please re-consult if social work needs arise.  Gabrielle Lyons, MSW Mon-Fri 8a-4:30p [phone]  

## 2015-10-14 NOTE — Progress Notes (Signed)
[facility] - [place] at [facility]   PATIENT NAME: Gabrielle Lyons    MR#:  [id number]  DATE OF BIRTH:  05/30/1933  SUBJECTIVE:  CHIEF COMPLAINT:   Chief Complaint  Patient presents with  . Weakness  Patient is a 82 year old African-American female with past medical history significant for history of essential hypertension, hyperlipidemia, COPD, peripheral vascular disease, status post right AKA in March 2017 who presents to the hospital with complaints of increasing weakness and fatigue. No pains, fever were reported.on arrival to the hospital,the patient's labs revealed hypokalemia, hypomagnesemia, mild elevation of troponin, normal CBC. Patient's blood pressure was markedly elevated and she was admitted to the hospital for therapy. Patient's blood pressure medications were advanced, however. Blood pressure has improved on minoxidil. Patient's brain MRI was unremarkable, but atrophy. Patient denies any pain,Is a little more alert, able to follow commands, answers questions appropriately.  Patient was able to eat a little bit better today. The patient and her family was seen by K today, discussed the discharge planning, decided on discharge home with hospice services, likely tomorrow when hospital bed is set up at home.      Review of Systems  Constitutional: Negative for chills, fever and weight loss.  HENT: Negative for congestion.   Eyes: Negative for blurred vision and double vision.  Respiratory: Negative for cough, sputum production, shortness of breath and wheezing.   Cardiovascular: Negative for chest pain, palpitations, orthopnea, leg swelling and PND.  Gastrointestinal: Negative for abdominal pain, blood in stool, constipation, diarrhea, nausea and vomiting.  Genitourinary: Negative for dysuria, frequency, hematuria and urgency.  Musculoskeletal: Negative for falls.  Neurological: Negative for dizziness, tremors, focal weakness and headaches.   Endo/Heme/Allergies: Does not bruise/bleed easily.  Psychiatric/Behavioral: Negative for depression. The patient does not have insomnia.     VITAL SIGNS: Blood pressure (!) 146/81, pulse 73, temperature 98.7 F (37.1 C), temperature source Oral, resp. rate 18, height 5\' 6"  (1.676 m), weight 49.7 kg (109 lb 9.6 oz), SpO2 99 %.  PHYSICAL EXAMINATION:   GENERAL:  82 y.o.-year-old patient lying in the bed with no acute distress. More alert, able to answer some questions appropriately, denies any pain EYES: Pupils equal, round, reactive to light and accommodation. No scleral icterus. Extraocular muscles intact.  HEENT: Head atraumatic, normocephalic. Oropharynx and nasopharynx clear.  NECK:  Supple, no jugular venous distention. No thyroid enlargement, no tenderness.  LUNGS: Normal breath sounds bilaterally, no wheezing, rales,rhonchi or crepitation. No use of accessory muscles of respiration.  CARDIOVASCULAR: S1, S2 normal. No murmurs, rubs, or gallops.  ABDOMEN: Soft, nontender, nondistended. Bowel sounds present. No organomegaly or mass.  EXTREMITIES: No pedal edema, cyanosis, or clubbing. Right AKA. Left lower extremity has coolness to touch in distal areas, black eschar due to necrosis and ulceration was noted on the tip of the great toe on the left. Peripheral pulses are not palpable, however obtainable via Doppler.  NEUROLOGIC: Cranial nerves II through XII are intact. Muscle strength 3/5 in all extremities. Sensation intact. Gait not checked.  PSYCHIATRIC: The patient is somnolent, however oriented x 2.  SKIN: No obvious rash, lesion, or ulcer.   ORDERS/RESULTS REVIEWED:   CBC  Recent Labs Lab 10/11/15 1727 10/12/15 0535  WBC 8.2 8.8  HGB 12.7 12.3  HCT 38.8 36.2  PLT 215 209  MCV 86.8 85.3  MCH 28.5 29.0  MCHC 32.8 33.9  RDW 18.7* 18.5*   ------------------------------------------------------------------------------------------------------------------  Chemistries    Recent Labs Lab 10/11/15 1727 10/12/15  0535 10/13/15 1402  NA 143 143  --   K 3.0* 3.1* 3.4*  CL 106 108  --   CO2 28 27  --   GLUCOSE 93 105*  --   BUN 14 13  --   CREATININE 0.68 0.60  --   CALCIUM 9.6 9.0  --   MG 1.5*  --  1.9  AST 18  --   --   ALT 10*  --   --   ALKPHOS 66  --   --   BILITOT 0.5  --   --    ------------------------------------------------------------------------------------------------------------------ estimated creatinine clearance is 44 mL/min (by C-G formula based on SCr of 0.8 mg/dL). ------------------------------------------------------------------------------------------------------------------  Recent Labs  10/11/15 2332  TSH 6.750*    Cardiac Enzymes  Recent Labs Lab 10/11/15 2332 10/12/15 0535 10/12/15 1056  TROPONINI 0.03* <0.03 <0.03   ------------------------------------------------------------------------------------------------------------------ Invalid input(s): POCBNP ---------------------------------------------------------------------------------------------------------------  RADIOLOGY: Mr Brain Ltd W/o Cm  Result Date: 10/12/2015 CLINICAL DATA:  82 year old female with weakness and elevated blood pressure. Initial encounter. EXAM: MRI HEAD WITHOUT CONTRAST TECHNIQUE: Multiplanar, multiecho pulse sequences of the brain and surrounding structures were obtained without intravenous contrast. COMPARISON:  Head CT without contrast 01/11/2015. FINDINGS: No restricted diffusion to suggest acute infarction. No midline shift, mass effect, evidence of mass lesion, ventriculomegaly, extra-axial collection or acute intracranial hemorrhage. Cervicomedullary junction and pituitary are within normal limits. Major intracranial vascular flow voids are preserved, with generalized intracranial artery dolichoectasia. Fairly numerous scattered chronic micro hemorrhages in the brain, including in the right basal ganglia, bilateral thalami, and  brainstem. However, he extent is not to the degree to typically seen with amyloid angiopathy. Superimposed Patchy and confluent nonspecific cerebral white matter T2 and FLAIR hyperintensity. Deep white matter capsule involvement. Superimposed T2 heterogeneity throughout the deep gray matter nuclei suggesting chronic lacunar infarcts. Lesser involvement of the pons. No cortical encephalomalacia identified. Visible internal auditory structures appear normal. Mastoids are clear. Mild paranasal sinus mucosal thickening and right maxillary sinus mucous retention cyst. Postoperative changes to the left globe. Otherwise negative orbit and scalp soft tissues. Normal bone marrow signal. Widespread cervical spine degeneration. IMPRESSION: 1.  No acute intracranial abnormality. 2. Signal changes most compatible with advanced chronic small vessel disease. Amyloid angiopathy, as an explanation for the multiple chronic micro-hemorrhages, is felt less likely. Electronically Signed   By: Odessa FlemingH  Hall M.D.   On: 10/12/2015 17:11    EKG:  Orders placed or performed during the hospital encounter of 10/11/15  . ED EKG  . ED EKG    ASSESSMENT AND PLAN:  Active Problems:   Hypertensive urgency #1. Malignant essential hypertension, continue clonidine and Norvasc, minoxidil, lisinopril, blood pressure Has improved, continue labetalol as needed intravenously #2. Altered mental status/Somnolence, Likely progression of dementia,  MRI of the brain showed no stroke, but atrophy, suspect decline due to dementia, although neurologically seems to be intact, palliative care is consulted, patient's family discussed patient's care and decided on home with hospice discharge, likely tomorrow.  #3 hypokalemia, supplemented orally, Follow potassium and magnesium levels tomorrow.  #4. Elevated troponin, likely demand ischemia, continue aspirin, labetalol, Pravachol, no interventions #5. Generalized weakness, physical and mental decline,  discussed with patient's family today, the patient will be discharged home with hospice tomorrow #6 failure to thrive, adult, continue Marinol while in the hospital, discussed with patient's family, patient will not benefit from Marinol as outpatient,they voiced  understanding and agreement.    Management plans discussed with the patient, family and they  are in agreement.   DRUG ALLERGIES:  Allergies  Allergen Reactions  . Sulfa Antibiotics Hives    CODE STATUS:     Code Status Orders        Start     Ordered   10/11/15 2311  Full code  Continuous     10/11/15 2310    Code Status History    Date Active Date Inactive Code Status Order ID Comments User Context   This patient has a current code status but no historical code status.      TOTAL TIME TAKING CARE OF THIS PATIENT: 35 minutes.  Discussed with family and palliative care  Prophet Renwick M.D on 10/14/2015 at 1:53 PM  Between 7am to 6pm - Pager - [phone]  After 6pm go to [email or web address] - password [email or web address] [facility]  [facility]  Office  [phone]  CC: Primary care physician; [facility] HospitalFELDPAUSCH, Madaline GuthrieALE E, MD

## 2015-10-15 DIAGNOSIS — F0391 Unspecified dementia with behavioral disturbance: Secondary | ICD-10-CM

## 2015-10-15 DIAGNOSIS — R778 Other specified abnormalities of plasma proteins: Secondary | ICD-10-CM

## 2015-10-15 DIAGNOSIS — F03918 Unspecified dementia, unspecified severity, with other behavioral disturbance: Secondary | ICD-10-CM

## 2015-10-15 DIAGNOSIS — E876 Hypokalemia: Secondary | ICD-10-CM

## 2015-10-15 DIAGNOSIS — R7989 Other specified abnormal findings of blood chemistry: Secondary | ICD-10-CM

## 2015-10-15 DIAGNOSIS — R627 Adult failure to thrive: Secondary | ICD-10-CM

## 2015-10-15 DIAGNOSIS — R531 Weakness: Secondary | ICD-10-CM

## 2015-10-15 MED ORDER — DOCUSATE SODIUM 100 MG PO CAPS: 100.0000 mg | ORAL_CAPSULE | Freq: Two times a day (BID) | ORAL | 0 refills | Status: AC

## 2015-10-15 MED ORDER — HALOPERIDOL LACTATE 2 MG/ML PO CONC
1.0000 mg | Freq: Two times a day (BID) | ORAL | Status: DC
  Administered 2015-10-15: 1 mg via ORAL
  Filled 2015-10-15 (×2): qty 0.5

## 2015-10-15 MED ORDER — HALOPERIDOL LACTATE 5 MG/ML IJ SOLN: 1.0000 mg | Freq: Four times a day (QID) | INTRAMUSCULAR | Status: DC | PRN

## 2015-10-15 MED ORDER — LISINOPRIL 40 MG PO TABS: 40.0000 mg | ORAL_TABLET | Freq: Two times a day (BID) | ORAL | 5 refills | Status: AC

## 2015-10-15 MED ORDER — AMLODIPINE BESYLATE 10 MG PO TABS: 10.0000 mg | ORAL_TABLET | Freq: Every day | ORAL | 5 refills | Status: AC

## 2015-10-15 MED ORDER — MORPHINE SULFATE (CONCENTRATE) 10 MG/0.5ML PO SOLN: 5.0000 mg | ORAL | 0 refills | Status: AC | PRN

## 2015-10-15 MED ORDER — MINOXIDIL 2.5 MG PO TABS: 2.5000 mg | ORAL_TABLET | Freq: Two times a day (BID) | ORAL | 6 refills | Status: AC

## 2015-10-15 MED ORDER — PANTOPRAZOLE SODIUM 40 MG PO TBEC: 40.0000 mg | DELAYED_RELEASE_TABLET | Freq: Every day | ORAL | 5 refills | Status: AC

## 2015-10-15 MED ORDER — SENNOSIDES-DOCUSATE SODIUM 8.6-50 MG PO TABS: 1.0000 | ORAL_TABLET | Freq: Every evening | ORAL | 5 refills | Status: AC | PRN

## 2015-10-15 MED ORDER — HALOPERIDOL LACTATE 2 MG/ML PO CONC: 1.0000 mg | Freq: Two times a day (BID) | ORAL | 6 refills | Status: AC

## 2015-10-15 MED ORDER — BISACODYL 5 MG PO TBEC: 5.0000 mg | DELAYED_RELEASE_TABLET | Freq: Every day | ORAL | 0 refills | Status: AC | PRN

## 2015-10-15 MED ORDER — ONDANSETRON HCL 4 MG PO TABS: 4.0000 mg | ORAL_TABLET | Freq: Four times a day (QID) | ORAL | 0 refills | Status: AC | PRN

## 2015-10-15 NOTE — Progress Notes (Signed)
Follow up visit to new referral for hospice of [place] services at home. Patient seen lying in bed, sister [name] and attending physician Dr. Winona LegatoVaickute at bedside. Patient will some agitation overnight, requiring mittens to be placed. She also refused all oral medications this morning, which she has been taking with out a problem. Family and physician concerned about taking her home and requested hospice home placement. Writer explained that there was no bed availability also discussed possible causes of agitation. Discussed symptoms with Palliative NP Lorinda CreedMary Larach, who in turn discussed with Dr. Winona LegatoVaickute. Patient to be given liquid haldol and will [facility] for monitoring of effectiveness. Family in a agreement. Hospital bed to be delivered this morning. Patient calm during visit, requesting fried fish. Will continue to follow through final disposition. Dayna BarkerKaren Robertson RN, BSN,. [facility] [place] [place], [facility] [phone] c

## 2015-10-15 NOTE — Care Management (Signed)
Patient is to discharge home today.  Family very much in agreement.  Patient has been calm and cooperative.  To travel by ems.  Copay 150 dollars. Informed family.  Patient's sister says that the equipment has arrived at the home.  Discussed  that there must be a person in the home to accept patient.  Informed that patient's daughter is at the residence.  Notified hospice liaison that attending is proceeding with discharge.   Patient did not take her morning meds which is felt to be reason for elevation in blood pressure.  Spoke with attending about trying to get patient to take these meds prior to discharge.  DNR signed and in packet.  Spoke with ems regarding family riding hoem with patient and informed that is is determined on a case by case basis when the drivers arrive to pick up patient.  Patient's sister's nephew will transport sister home.

## 2015-10-15 NOTE — Plan of Care (Signed)
Problem: Safety: Goal: Ability to remain free from injury will improve Outcome: Not Progressing Patient is increasingly confused, unaware of envirnoment.  Problem: Tissue Perfusion: Goal: Risk factors for ineffective tissue perfusion will decrease Outcome: Not Progressing Patient is uncooperative, will not allow staff to reposition q2h.

## 2015-10-15 NOTE — Discharge Summary (Signed)
[facility] [place] [facility]   PATIENT NAME: Gabrielle AlbeeLillie Lyons    MR#:  [id number]  DATE OF BIRTH:  09/09/1933  DATE OF ADMISSION:  10/11/2015 ADMITTING PHYSICIAN: Jon GillsAlexis Hugelmeyer, DO  DATE OF DISCHARGE: No discharge date for patient encounter.  PRIMARY CARE PHYSICIAN: FELDPAUSCH, DALE E, MD     ADMISSION DIAGNOSIS:  Hypokalemia [E87.6] Hypomagnesemia [E83.42] Weakness [R53.1] Foot pain, left [M79.672] Hypertensive urgency, malignant [I16.0]  DISCHARGE DIAGNOSIS:  Active Problems:   Hypertensive urgency   Weakness   Palliative care by specialist   DNR (do not resuscitate)   Adult failure to thrive syndrome   Dementia with behavioral disturbance   Hypokalemia   Elevated troponin   Generalized weakness   Adult failure to thrive   SECONDARY DIAGNOSIS:   Past Medical History:  Diagnosis Date  . Abdominal aneurysm (HCC)    also, "poor circulation" - legs  . COPD (chronic obstructive pulmonary disease) (HCC)   . GERD (gastroesophageal reflux disease)   . H/O blood clots    legs  . Hypercholesteremia   . Hypertension   . Mitral valve insufficiency   . Shortness of breath dyspnea    with exertion    .pro HOSPITAL COURSE:  Patient is a 82 year old African-American female with past medical history significant for history of essential hypertension, hyperlipidemia, COPD, peripheral vascular disease, status post right AKA in March 2017 who presents to the hospital with complaints of increasing weakness and fatigue, Altered mental status, sundowning. No pains, fever were reported.on arrival to the hospital,the patient's labs revealed hypokalemia, hypomagnesemia, mild elevation of troponin, normal CBC. Patient's blood pressure was markedly elevated and she was admitted to the hospital for therapy. Patient's blood pressure medications were advanced, however, the blood pressure has improved only with minoxidil. Patient's brain MRI was unremarkable,  but atrophy. The patient and family had discussion with palliative care and that decided on hospice at home services. While in the hospital patient was noted to be sundowning, requiring Haldol orally. Patient was felt to be stable to be discharged home with hospice today. Discussion by problem: #1. Malignant essential hypertension, continue clonidine and Norvasc, minoxidil, lisinopril, blood pressure Has improved, however, worsens when patient is agitated #2. Altered mental status/Somnolence, Likely progression of dementia with some behavioral disturbance  intermittently, possible an element of delirium,  MRI of the brain showed no stroke, but atrophy, suspect decline due to dementia,  palliative care was consulted, patient's family discussed patient's care and decided on home with hospice discharge today.  #3  hypokalemia, supplemented orally, magnesium level was checked, 1.9, therapeutic  #4. Elevated troponin, likely demand ischemia, continue aspirin, labetalol, Pravachol, no interventions #5. Generalized weakness, physical and mental decline, discussed with patient's family , the patient is being discharged home with hospice today.  #6 failure to thrive, adult, continue Marinol , unlikely to benefit in the long run  DISCHARGE CONDITIONS:   Stable  CONSULTS OBTAINED:    DRUG ALLERGIES:   Allergies  Allergen Reactions  . Sulfa Antibiotics Hives    DISCHARGE MEDICATIONS:   Current Discharge Medication List    START taking these medications   Details  bisacodyl (DULCOLAX) 5 MG EC tablet Take 1 tablet (5 mg total) by mouth daily as needed for moderate constipation. Qty: 30 tablet, Refills: 0    docusate sodium (COLACE) 100 MG capsule Take 1 capsule (100 mg total) by mouth 2 (two) times daily. Qty: 10 capsule, Refills: 0    haloperidol (HALDOL) 2  MG/ML solution Take 0.5 mLs (1 mg total) by mouth 2 (two) times daily. Qty: 60 mL, Refills: 6    magnesium chloride (SLOW-MAG) 64 MG  TBEC SR tablet Take 1 tablet (64 mg total) by mouth daily. Qty: 30 tablet, Refills: 1    minoxidil (LONITEN) 2.5 MG tablet Take 1 tablet (2.5 mg total) by mouth 2 (two) times daily. Qty: 60 tablet, Refills: 6    Morphine Sulfate (MORPHINE CONCENTRATE) 10 MG/0.5ML SOLN concentrated solution Take 0.25 mLs (5 mg total) by mouth every 2 (two) hours as needed for moderate pain, severe pain or shortness of breath. Qty: 30 mL, Refills: 0    ondansetron (ZOFRAN) 4 MG tablet Take 1 tablet (4 mg total) by mouth every 6 (six) hours as needed for nausea. Qty: 20 tablet, Refills: 0    pantoprazole (PROTONIX) 40 MG tablet Take 1 tablet (40 mg total) by mouth daily. Qty: 30 tablet, Refills: 5    potassium chloride (K-DUR) 10 MEQ tablet Take 1 tablet (10 mEq total) by mouth daily. Qty: 30 tablet, Refills: 1    senna-docusate (SENOKOT-S) 8.6-50 MG tablet Take 1 tablet by mouth at bedtime as needed for mild constipation. Qty: 30 tablet, Refills: 5      CONTINUE these medications which have CHANGED   Details  amLODipine (NORVASC) 10 MG tablet Take 1 tablet (10 mg total) by mouth daily. Qty: 30 tablet, Refills: 5    lisinopril (PRINIVIL,ZESTRIL) 40 MG tablet Take 1 tablet (40 mg total) by mouth 2 (two) times daily. Qty: 60 tablet, Refills: 5      CONTINUE these medications which have NOT CHANGED   Details  acetaminophen (TYLENOL) 500 MG tablet Take 1,000 mg by mouth every 8 (eight) hours as needed.    cloNIDine (CATAPRES) 0.3 MG tablet Take 0.3 mg by mouth 2 (two) times daily.    DULoxetine (CYMBALTA) 30 MG capsule Take 30 mg by mouth every morning.       STOP taking these medications     aspirin EC 81 MG tablet      pravastatin (PRAVACHOL) 10 MG tablet      clopidogrel (PLAVIX) 75 MG tablet      HYDROcodone-acetaminophen (NORCO) 5-325 MG tablet      calcium carbonate (TUMS - DOSED IN MG ELEMENTAL CALCIUM) 500 MG chewable tablet      naproxen sodium (ANAPROX) 220 MG tablet       omeprazole (PRILOSEC) 20 MG capsule          DISCHARGE INSTRUCTIONS:    Patient is to follow-up with primary care physician  If you experience worsening of your admission symptoms, develop shortness of breath, life threatening emergency, suicidal or homicidal thoughts you must seek medical attention immediately by calling 911 or calling your MD immediately  if symptoms less severe.  You Must read complete instructions/literature along with all the possible adverse reactions/side effects for all the Medicines you take and that have been prescribed to you. Take any new Medicines after you have completely understood and accept all the possible adverse reactions/side effects.   Please note  You were cared for by a hospitalist during your hospital stay. If you have any questions about your discharge medications or the care you received while you were in the hospital after you are discharged, you can call the unit and asked to speak with the hospitalist on call if the hospitalist that took care of you is not available. Once you are discharged, your primary care physician will  handle any further medical issues. Please note that NO REFILLS for any discharge medications will be authorized once you are discharged, as it is imperative that you return to your primary care physician (or establish a relationship with a primary care physician if you do not have one) for your aftercare needs so that they can reassess your need for medications and monitor your lab values.    Today   CHIEF COMPLAINT:   Chief Complaint  Patient presents with  . Weakness    HISTORY OF PRESENT ILLNESS:  Gabrielle Lyons  is a 82 y.o. female with a known history of essential hypertension, hyperlipidemia, COPD, peripheral vascular disease, status post right AKA in March 2017 who presents to the hospital with complaints of increasing weakness and fatigue, Altered mental status, sundowning. No pains, fever were reported.on  arrival to the hospital,the patient's labs revealed hypokalemia, hypomagnesemia, mild elevation of troponin, normal CBC. Patient's blood pressure was markedly elevated and she was admitted to the hospital for therapy. Patient's blood pressure medications were advanced, however, the blood pressure has improved only with minoxidil. Patient's brain MRI was unremarkable, but atrophy. The patient and family had discussion with palliative care and that decided on hospice at home services. While in the hospital patient was noted to be sundowning, requiring Haldol orally. Patient was felt to be stable to be discharged home with hospice today. Discussion by problem: #1. Malignant essential hypertension, continue clonidine and Norvasc, minoxidil, lisinopril, blood pressure Has improved, however, worsens when patient is agitated #2. Altered mental status/Somnolence, Likely progression of dementia with some behavioral disturbance  intermittently, possible an element of delirium,  MRI of the brain showed no stroke, but atrophy, suspect decline due to dementia,  palliative care was consulted, patient's family discussed patient's care and decided on home with hospice discharge today.  #3  hypokalemia, supplemented orally, magnesium level was checked, 1.9, therapeutic  #4. Elevated troponin, likely demand ischemia, continue aspirin, labetalol, Pravachol, no interventions #5. Generalized weakness, physical and mental decline, discussed with patient's family , the patient is being discharged home with hospice today.  #6 failure to thrive, adult, continue Marinol , unlikely to benefit in the long run    VITAL SIGNS:  Blood pressure (!) 175/103, pulse 87, temperature (!) 96.7 F (35.9 C), temperature source Axillary, resp. rate 18, height 5\' 6"  (1.676 m), weight 49.7 kg (109 lb 9.6 oz), SpO2 95 %.  I/O:   Intake/Output Summary (Last 24 hours) at 10/15/15 1447 Last data filed at 10/15/15 0957  Gross per 24 hour   Intake                0 ml  Output                0 ml  Net                0 ml    PHYSICAL EXAMINATION:  GENERAL:  82 y.o.-year-old patient lying in the bed with no acute distress.  EYES: Pupils equal, round, reactive to light and accommodation. No scleral icterus. Extraocular muscles intact.  HEENT: Head atraumatic, normocephalic. Oropharynx and nasopharynx clear.  NECK:  Supple, no jugular venous distention. No thyroid enlargement, no tenderness.  LUNGS: Normal breath sounds bilaterally, no wheezing, rales,rhonchi or crepitation. No use of accessory muscles of respiration.  CARDIOVASCULAR: S1, S2 normal. No murmurs, rubs, or gallops.  ABDOMEN: Soft, non-tender, non-distended. Bowel sounds present. No organomegaly or mass.  EXTREMITIES: No pedal edema, cyanosis, or clubbing.  NEUROLOGIC: Cranial nerves II through XII are intact. Muscle strength 5/5 in all extremities. Sensation intact. Gait not checked.  PSYCHIATRIC: The patient is alert and oriented x 3.  SKIN: No obvious rash, lesion, or ulcer.   DATA REVIEW:   CBC  Recent Labs Lab 10/12/15 0535  WBC 8.8  HGB 12.3  HCT 36.2  PLT 209    Chemistries   Recent Labs Lab 10/11/15 1727 10/12/15 0535 10/13/15 1402  NA 143 143  --   K 3.0* 3.1* 3.4*  CL 106 108  --   CO2 28 27  --   GLUCOSE 93 105*  --   BUN 14 13  --   CREATININE 0.68 0.60  --   CALCIUM 9.6 9.0  --   MG 1.5*  --  1.9  AST 18  --   --   ALT 10*  --   --   ALKPHOS 66  --   --   BILITOT 0.5  --   --     Cardiac Enzymes  Recent Labs Lab 10/12/15 1056  TROPONINI <0.03    Microbiology Results  Results for orders placed or performed during the hospital encounter of 10/11/15  Urine culture     Status: None   Collection Time: 10/11/15  5:27 PM  Result Value Ref Range Status   Specimen Description URINE, RANDOM  Final   Special Requests NONE  Final   Culture NO GROWTH Performed at [facility]   Final   Report Status 10/13/2015 FINAL   Final    RADIOLOGY:  No results found.  EKG:   Orders placed or performed during the hospital encounter of 10/11/15  . ED EKG  . ED EKG      Management plans discussed with the patient, family and they are in agreement.  CODE STATUS:     Code Status Orders        Start     Ordered   10/14/15 1202  Do not attempt resuscitation (DNR)  Continuous    Question Answer Comment  In the event of cardiac or respiratory ARREST Do not call a "code blue"   In the event of cardiac or respiratory ARREST Do not perform Intubation, CPR, defibrillation or ACLS   In the event of cardiac or respiratory ARREST Use medication by any route, position, wound care, and other measures to relive pain and suffering. May use oxygen, suction and manual treatment of airway obstruction as needed for comfort.      10/14/15 1204    Code Status History    Date Active Date Inactive Code Status Order ID Comments User Context   10/11/2015 11:10 PM 10/14/2015 12:04 PM Full Code [id number]  Tonye Royalty, DO Inpatient      TOTAL TIME TAKING CARE OF THIS PATIENT: 40 minutes.    Katharina Caper M.D on 10/15/2015 at 2:47 PM  Between 7am to 6pm - Pager - [phone]  After 6pm go to [email or web address] - password [email or web address] [facility]  [place]  Office  [phone]  CC: Primary care physician; [facility], Madaline Guthrie, MD

## 2015-10-15 NOTE — Care Management (Signed)
Patient became extremely agitated, physically and verbally aggressive to staff and family members during the night.  She refused all medications.  Family will not be able to manage patient's care in the home with these behaviors.  Haldol has been ordered in hopes to control behavior.  Would anticipate patient will require continued stay at least overnight to determine if medication change is effective

## 2015-10-15 NOTE — Progress Notes (Signed)
Patient is refusing care and medications. Attempted to administer with ice cream. Family member states patient loves ice cream. Patient refuses to be repositioned.  Patient is combative with staff and sister. Patient will no cooperate with treatment plan. Will continue to monitor and assess.

## 2015-10-15 NOTE — Progress Notes (Signed)
In[facility] Ermalene SearingNann Greene that Ms. Gabrielle Lyons will be discharging home today. She has remained calm and cooperative after administration of the liquid haldol late this morning. Hospice referral notified. Dayna BarkerKaren Robertson RN, BSN, [facility], hospital Liaison [phone] c

## 2015-12-25 ENCOUNTER — Other Ambulatory Visit (INDEPENDENT_AMBULATORY_CARE_PROVIDER_SITE_OTHER): Payer: Self-pay | Admitting: Vascular Surgery

## 2015-12-25 DIAGNOSIS — I714 Abdominal aortic aneurysm, without rupture, unspecified: Secondary | ICD-10-CM

## 2015-12-30 ENCOUNTER — Ambulatory Visit (INDEPENDENT_AMBULATORY_CARE_PROVIDER_SITE_OTHER)

## 2015-12-30 ENCOUNTER — Encounter (INDEPENDENT_AMBULATORY_CARE_PROVIDER_SITE_OTHER): Payer: Self-pay | Admitting: Vascular Surgery

## 2015-12-30 ENCOUNTER — Ambulatory Visit (INDEPENDENT_AMBULATORY_CARE_PROVIDER_SITE_OTHER): Admitting: Vascular Surgery

## 2015-12-30 VITALS — BP 150/90 | HR 83 | Resp 16 | Ht 65.5 in | Wt 140.0 lb

## 2015-12-30 DIAGNOSIS — I10 Essential (primary) hypertension: Secondary | ICD-10-CM | POA: Insufficient documentation

## 2015-12-30 DIAGNOSIS — I7025 Atherosclerosis of native arteries of other extremities with ulceration: Secondary | ICD-10-CM | POA: Diagnosis not present

## 2015-12-30 DIAGNOSIS — I714 Abdominal aortic aneurysm, without rupture, unspecified: Secondary | ICD-10-CM

## 2015-12-30 DIAGNOSIS — F0391 Unspecified dementia with behavioral disturbance: Secondary | ICD-10-CM | POA: Diagnosis not present

## 2015-12-30 DIAGNOSIS — I723 Aneurysm of iliac artery: Secondary | ICD-10-CM | POA: Diagnosis not present

## 2015-12-30 DIAGNOSIS — Z89619 Acquired absence of unspecified leg above knee: Secondary | ICD-10-CM | POA: Insufficient documentation

## 2015-12-30 DIAGNOSIS — Z89611 Acquired absence of right leg above knee: Secondary | ICD-10-CM

## 2015-12-30 DIAGNOSIS — R627 Adult failure to thrive: Secondary | ICD-10-CM | POA: Diagnosis not present

## 2015-12-30 NOTE — Patient Instructions (Signed)
Abdominal Aortic Aneurysm An aneurysm is a weakened or damaged part of an artery wall that bulges from the normal force of blood pumping through the body. An abdominal aortic aneurysm is an aneurysm that occurs in the lower part of the aorta, the main artery of the body.  The major concern with an abdominal aortic aneurysm is that it can enlarge and burst (rupture) or blood can flow between the layers of the wall of the aorta through a tear (aorticdissection). Both of these conditions can cause bleeding inside the body and can be life threatening unless diagnosed and treated promptly. CAUSES  The exact cause of an abdominal aortic aneurysm is unknown. Some contributing factors are:   A hardening of the arteries caused by the buildup of fat and other substances in the lining of a blood vessel (arteriosclerosis).  Inflammation of the walls of an artery (arteritis).   Connective tissue diseases, such as Marfan syndrome.   Abdominal trauma.   An infection, such as syphilis or staphylococcus, in the wall of the aorta (infectious aortitis) caused by bacteria. RISK FACTORS  Risk factors that contribute to an abdominal aortic aneurysm may include:  Age older than 60 years.   High blood pressure (hypertension).  Female gender.  Ethnicity (white race).  Obesity.  Family history of aneurysm (first degree relatives only).  Tobacco use. PREVENTION  The following healthy lifestyle habits may help decrease your risk of abdominal aortic aneurysm:  Quitting smoking. Smoking can raise your blood pressure and cause arteriosclerosis.  Limiting or avoiding alcohol.  Keeping your blood pressure, blood sugar level, and cholesterol levels within normal limits.  Decreasing your salt intake. In somepeople, too much salt can raise blood pressure and increase your risk of abdominal aortic aneurysm.  Eating a diet low in saturated fats and cholesterol.  Increasing your fiber intake by including  whole grains, vegetables, and fruits in your diet. Eating these foods may help lower blood pressure.  Maintaining a healthy weight.  Staying physically active and exercising regularly. SYMPTOMS  The symptoms of abdominal aortic aneurysm may vary depending on the size and rate of growth of the aneurysm.Most grow slowly and do not have any symptoms. When symptoms do occur, they may include:  Pain (abdomen, side, lower back, or groin). The pain may vary in intensity. A sudden onset of severe pain may indicate that the aneurysm has ruptured.  Feeling full after eating only small amounts of food.  Nausea or vomiting or both.  Feeling a pulsating lump in the abdomen.  Feeling faint or passing out. DIAGNOSIS  Since most unruptured abdominal aortic aneurysms have no symptoms, they are often discovered during diagnostic exams for other conditions. An aneurysm may be found during the following procedures:  Ultrasonography (A one-time screening for abdominal aortic aneurysm by ultrasonography is also recommended for all men aged 65-75 years who have ever smoked).  X-ray exams.  A computed tomography (CT).  Magnetic resonance imaging (MRI).  Angiography or arteriography. TREATMENT  Treatment of an abdominal aortic aneurysm depends on the size of your aneurysm, your age, and risk factors for rupture. Medication to control blood pressure and pain may be used to manage aneurysms smaller than 6 cm. Regular monitoring for enlargement may be recommended by your caregiver if:  The aneurysm is 3-4 cm in size (an annual ultrasonography may be recommended).  The aneurysm is 4-4.5 cm in size (an ultrasonography every 6 months may be recommended).  The aneurysm is larger than 4.5 cm in   size (your caregiver may ask that you be examined by a vascular surgeon). If your aneurysm is larger than 6 cm, surgical repair may be recommended. There are two main methods for repair of an aneurysm:   Endovascular  repair (a minimally invasive surgery). This is done most often.  Open repair. This method is used if an endovascular repair is not possible.   This information is not intended to replace advice given to you by your health care provider. Make sure you discuss any questions you have with your health care provider.   Document Released: 11/18/2004 Document Revised: 06/05/2012 Document Reviewed: 03/10/2012 Elsevier Interactive Patient Education 2016 [employer]  

## 2015-12-30 NOTE — Assessment & Plan Note (Signed)
Left great toe ulceration. Has known severe peripheral vascular disease. In discussions with the caregiver, she is now in hospice services and she does not want any further interventions performed. This wound will likely progress without therapy. We had a long discussion regarding the expected outcome and she is comfortable with no intervention at this time.

## 2015-12-30 NOTE — Assessment & Plan Note (Signed)
blood pressure control important in reducing the progression of atherosclerotic disease. On appropriate oral medications.  

## 2015-12-30 NOTE — Assessment & Plan Note (Signed)
See below

## 2015-12-30 NOTE — Assessment & Plan Note (Signed)
Stable and healed 

## 2015-12-30 NOTE — Assessment & Plan Note (Signed)
Now on hospice services

## 2015-12-30 NOTE — Assessment & Plan Note (Signed)
Now on hospice.

## 2015-12-30 NOTE — Assessment & Plan Note (Signed)
The patient has continued growth of her abdominal aortic aneurysm and bilateral iliac artery aneurysms. This is now basically 4 cm in the aorta and 2.94 cm in the right common iliac artery. A left common iliac arteries up to 2.44 cm. With continued growth of these aneurysms, or now approaching a size where we will have to determine whether or not this needs to be repaired. With the patient now on hospice services, the caregiver has decided not to pursue any repair. I think this is reasonable. The patient is unlikely to survive any major surgery at this point given her debilitated condition. This is a very difficult decision, but the caregiver seems comfortable with this. I will see her back as needed.

## 2015-12-30 NOTE — Progress Notes (Signed)
MRN : [id number]  Gabrielle Lyons is a 82 y.o. (12/15/1933) female who presents with chief complaint of  Chief Complaint  Patient presents with  . Follow-up  .  History of Present Illness: Patient returns today in follow up of Multiple vascular issues. Her medical condition continues to decline and she is now getting hospice services. She has a left great toe ulceration which is steadily worsening. This is somewhat painful, and she is getting medication for this. The caregiver has declined intervention on this previously. He is here today to get her noninvasive studies for her abdominal aortic and iliac artery aneurysms. She does not have any obvious aneurysm related symptoms. Specifically, the patient denies new back or abdominal pain, or signs of peripheral embolization The patient has continued growth of her abdominal aortic aneurysm and bilateral iliac artery aneurysms. This is now basically 4 cm in the aorta and 2.94 cm in the right common iliac artery. A left common iliac arteries up to 2.44 cm.    Current Outpatient Prescriptions  Medication Sig Dispense Refill  . acetaminophen (TYLENOL) 500 MG tablet Take 1,000 mg by mouth every 8 (eight) hours as needed.    Marland Kitchen amLODipine (NORVASC) 10 MG tablet Take 1 tablet (10 mg total) by mouth daily. 30 tablet 5  . bisacodyl (DULCOLAX) 5 MG EC tablet Take 1 tablet (5 mg total) by mouth daily as needed for moderate constipation. 30 tablet 0  . cloNIDine (CATAPRES) 0.3 MG tablet Take 0.3 mg by mouth 2 (two) times daily.    Marland Kitchen docusate sodium (COLACE) 100 MG capsule Take 1 capsule (100 mg total) by mouth 2 (two) times daily. 10 capsule 0  . DULoxetine (CYMBALTA) 30 MG capsule Take 30 mg by mouth every morning.     . haloperidol (HALDOL) 2 MG/ML solution Take 0.5 mLs (1 mg total) by mouth 2 (two) times daily. 60 mL 6  . lisinopril (PRINIVIL,ZESTRIL) 40 MG tablet Take 1 tablet (40 mg total) by mouth 2 (two) times daily. 60 tablet 5  . magnesium  chloride (SLOW-MAG) 64 MG TBEC SR tablet Take 1 tablet (64 mg total) by mouth daily. 30 tablet 1  . minoxidil (LONITEN) 2.5 MG tablet Take 1 tablet (2.5 mg total) by mouth 2 (two) times daily. 60 tablet 6  . Morphine Sulfate (MORPHINE CONCENTRATE) 10 MG/0.5ML SOLN concentrated solution Take 0.25 mLs (5 mg total) by mouth every 2 (two) hours as needed for moderate pain, severe pain or shortness of breath. 30 mL 0  . ondansetron (ZOFRAN) 4 MG tablet Take 1 tablet (4 mg total) by mouth every 6 (six) hours as needed for nausea. 20 tablet 0  . pantoprazole (PROTONIX) 40 MG tablet Take 1 tablet (40 mg total) by mouth daily. 30 tablet 5  . potassium chloride (K-DUR) 10 MEQ tablet Take 1 tablet (10 mEq total) by mouth daily. 30 tablet 1  . senna-docusate (SENOKOT-S) 8.6-50 MG tablet Take 1 tablet by mouth at bedtime as needed for mild constipation. 30 tablet 5   No current facility-administered medications for this visit.     Past Medical History:  Diagnosis Date  . Abdominal aneurysm ([place])    also, "poor circulation" - legs  . COPD (chronic obstructive pulmonary disease) ([place])   . GERD (gastroesophageal reflux disease)   . H/O blood clots    legs  . Hypercholesteremia   . Hypertension   . Mitral valve insufficiency   . Shortness of breath dyspnea    with  exertion    Past Surgical History:  Procedure Laterality Date  . BREAST LUMPECTOMY Left   . CATARACT EXTRACTION W/PHACO Left 11/18/2014   Procedure: CATARACT EXTRACTION PHACO AND INTRAOCULAR LENS PLACEMENT (IOC);  Surgeon: Ronnell Freshwater, MD;  Location: [facility];  Service: Ophthalmology;  Laterality: Left;  . CYST EXCISION     back  . FEMORAL ARTERY STENT Right   . OVARIAN CYST REMOVAL    . PERIPHERAL VASCULAR CATHETERIZATION Right 05/12/2015   Procedure: Lower Extremity Angiography;  Surgeon: Algernon Huxley, MD;  Location: [facility];  Service: Cardiovascular;  Laterality: Right;  . PERIPHERAL VASCULAR  CATHETERIZATION  05/12/2015   Procedure: Lower Extremity Intervention;  Surgeon: Algernon Huxley, MD;  Location: [facility];  Service: Cardiovascular;;  . PERIPHERAL VASCULAR CATHETERIZATION Left 07/07/2015   Procedure: Lower Extremity Angiography;  Surgeon: Algernon Huxley, MD;  Location: [facility];  Service: Cardiovascular;  Laterality: Left;  . PERIPHERAL VASCULAR CATHETERIZATION  07/07/2015   Procedure: Peripheral Vascular Intervention;  Surgeon: Algernon Huxley, MD;  Location: [facility];  Service: Cardiovascular;;  . rt leg AKA    . TOE AMPUTATION Right   . TONSILLECTOMY      Social History Social History  Substance Use Topics  . Smoking status: Former Smoker    Quit date: 10/23/2013  . Smokeless tobacco: Current User    Types: Snuff  . Alcohol use No  lives with caregiver   Family History No bleeding disorders, clotting disorders, or autoimmune diseases  Allergies  Allergen Reactions  . Sulfa Antibiotics Hives     REVIEW OF SYSTEMS (Negative unless checked)  Constitutional: _0 Weight loss  _1 Fever  _2 Chills Cardiac: _3 Chest pain   _4 Chest pressure   _5 Palpitations   _6 Shortness of breath when laying flat   _7 Shortness of breath at rest   _8 Shortness of breath with exertion. Vascular:  _9 Pain in legs with walking   _10 Pain in legs at rest   _11 Pain in legs when laying flat   _12 Claudication   _13 Pain in feet when walking  _14 Pain in feet at rest  _15 Pain in feet when laying flat   _16 History of DVT   _17 Phlebitis   _18 Swelling in legs   _19 Varicose veins   _20 Non-healing ulcers Pulmonary:   _21 Uses home oxygen   _22 Productive cough   _23 Hemoptysis   _24 Wheeze  _25 COPD   _26 Asthma Neurologic:  _27 Dizziness  _28 Blackouts   _29 Seizures   _30 History of stroke   _31 History of TIA  _32 Aphasia   _33 Temporary blindness   _34 Dysphagia   _35 Weakness or numbness in arms   _36 Weakness or numbness in legs Musculoskeletal:  _37 Arthritis   _38 Joint swelling   _39 Joint pain   _40 Low back  pain Hematologic:  _41 Easy bruising  _42 Easy bleeding   _43 Hypercoagulable state   _44 Anemic   Gastrointestinal:  _45 Blood in stool   _46 Vomiting blood  _47 Gastroesophageal reflux/heartburn   _48 Abdominal pain Genitourinary:  _49 Chronic kidney disease   _50 Difficult urination  _51 Frequent urination  _52 Burning with urination   _53 Hematuria Skin:  _54 Rashes   _55 Ulcers   _56 Wounds Psychological:  _57 History of anxiety   _58  History of major depression.  Physical Examination  BP (!) 150/90   Pulse 83   Resp 16   Ht 5' 5.5" (1.664 m)   Wt 140 lb (63.5 kg)   BMI 22.94 kg/m  Gen:  Thin, frail appearing Head: [place]/AT, + temporalis wasting. Ear/Nose/Throat: Hearing grossly intact, nares w/o erythema or drainage, trachea midline  Eyes: Conjunctiva clear. Sclera non-icteric Neck: Supple.  No JVD.  Pulmonary:  Good air movement, no use of accessory muscles.  Cardiac: RRR, normal S1, S2 Vascular:  Vessel Right Left  Radial Palpable Palpable  Ulnar Palpable Palpable  Brachial Palpable Palpable  Carotid Palpable, without bruit Palpable, without bruit  Aorta enlarged N/A  Femoral Palpable Palpable  Popliteal Not Palpable Not Palpable  PT Not Palpable Trace Palpable  DP Not Palpable Not Palpable   Gastrointestinal: soft, non-tender/non-distended. No guarding/reflex.  Musculoskeletal: Right AKA. Left foot ulceration on the tip of the great toe with fibrinous exudate.  No erythema or drainage.   Neurologic: slow reaction time.  Moves all four extremities Psychiatric: flat affect, poor historian.   Dermatologic: open ulceration on the left great toe as above Lymph : No Cervical, Axillary, or Inguinal lymphadenopathy.      Labs Recent Results (from the past 2160 hour(s))  Urinalysis complete, with microscopic     Status: Abnormal   Collection Time: 10/11/15  5:27 PM  Result Value Ref Range   Color, Urine YELLOW (A) YELLOW   APPearance CLEAR (A) CLEAR   Glucose, UA NEGATIVE NEGATIVE mg/dL   Bilirubin  Urine NEGATIVE NEGATIVE   Ketones, ur NEGATIVE NEGATIVE mg/dL   Specific Gravity, Urine 1.018 1.005 - 1.030   Hgb urine dipstick 1+ (A) NEGATIVE   pH 5.0 5.0 - 8.0   Protein, ur 100 (A) NEGATIVE mg/dL   Nitrite NEGATIVE NEGATIVE   Leukocytes, UA NEGATIVE NEGATIVE   RBC / HPF 6-30 0 - 5 RBC/hpf   WBC, UA 0-5 0 - 5 WBC/hpf   Bacteria, UA NONE SEEN NONE SEEN   Squamous Epithelial / LPF NONE SEEN NONE SEEN  Urine culture     Status: None   Collection Time: 10/11/15  5:27 PM  Result Value Ref Range   Specimen Description URINE, RANDOM    Special Requests NONE    Culture NO GROWTH Performed at [facility]     Report Status 10/13/2015 FINAL   Comprehensive metabolic panel     Status: Abnormal   Collection Time: 10/11/15  5:27 PM  Result Value Ref Range   Sodium 143 135 - 145 mmol/L   Potassium 3.0 (L) 3.5 - 5.1 mmol/L   Chloride 106 101 - 111 mmol/L   CO2 28 22 - 32 mmol/L   Glucose, Bld 93 65 - 99 mg/dL   BUN 14 6 - 20 mg/dL   Creatinine, Ser 0.68 0.44 - 1.00 mg/dL   Calcium 9.6 8.9 - 10.3 mg/dL   Total Protein 7.5 6.5 - 8.1 g/dL   Albumin 4.1 3.5 - 5.0 g/dL   AST 18 15 - 41 U/L   ALT 10 (L) 14 - 54 U/L   Alkaline Phosphatase 66 38 - 126 U/L   Total Bilirubin 0.5 0.3 - 1.2 mg/dL   GFR calc non Af Amer >60 >60 mL/min   GFR calc Af Amer >60 >60 mL/min    Comment: (NOTE) The eGFR has been calculated using the CKD EPI equation. This calculation has not been validated in all clinical situations. eGFR's persistently <60 mL/min signify possible Chronic Kidney Disease.    Anion gap 9 5 - 15  Troponin I     Status: None   Collection Time: 10/11/15  5:27 PM  Result Value Ref Range   Troponin I <0.03 <0.03 ng/mL  Magnesium     Status: Abnormal   Collection Time: 10/11/15  5:27 PM  Result Value  Ref Range   Magnesium 1.5 (L) 1.7 - 2.4 mg/dL  CBC     Status: Abnormal   Collection Time: 10/11/15  5:27 PM  Result Value Ref Range   WBC 8.2 3.6 - 11.0 K/uL   RBC 4.47 3.80 -  5.20 MIL/uL   Hemoglobin 12.7 12.0 - 16.0 g/dL   HCT 38.8 35.0 - 47.0 %   MCV 86.8 80.0 - 100.0 fL   MCH 28.5 26.0 - 34.0 pg   MCHC 32.8 32.0 - 36.0 g/dL   RDW 18.7 (H) 11.5 - 14.5 %   Platelets 215 150 - 440 K/uL  Acetaminophen level     Status: Abnormal   Collection Time: 10/11/15 11:32 PM  Result Value Ref Range   Acetaminophen (Tylenol), Serum <10 (L) 10 - 30 ug/mL    Comment:        THERAPEUTIC CONCENTRATIONS VARY SIGNIFICANTLY. A RANGE OF 10-30 ug/mL MAY BE AN EFFECTIVE CONCENTRATION FOR MANY PATIENTS. HOWEVER, SOME ARE BEST TREATED AT CONCENTRATIONS OUTSIDE THIS RANGE. ACETAMINOPHEN CONCENTRATIONS >150 ug/mL AT 4 HOURS AFTER INGESTION AND >50 ug/mL AT 12 HOURS AFTER INGESTION ARE OFTEN ASSOCIATED WITH TOXIC REACTIONS.   CK     Status: Abnormal   Collection Time: 10/11/15 11:32 PM  Result Value Ref Range   Total CK 19 (L) 38 - 871 U/L  Salicylate level     Status: None   Collection Time: 10/11/15 11:32 PM  Result Value Ref Range   Salicylate Lvl <9.5 2.8 - 30.0 mg/dL  Troponin I     Status: Abnormal   Collection Time: 10/11/15 11:32 PM  Result Value Ref Range   Troponin I 0.03 (HH) <0.03 ng/mL    Comment: CRITICAL RESULT CALLED TO, READ BACK BY AND VERIFIED WITH SHEKIRRA NESBITT AT 0028 10/12/15.PMH  TSH     Status: Abnormal   Collection Time: 10/11/15 11:32 PM  Result Value Ref Range   TSH 6.750 (H) 0.350 - 4.500 uIU/mL  Basic metabolic panel     Status: Abnormal   Collection Time: 10/12/15  5:35 AM  Result Value Ref Range   Sodium 143 135 - 145 mmol/L   Potassium 3.1 (L) 3.5 - 5.1 mmol/L   Chloride 108 101 - 111 mmol/L   CO2 27 22 - 32 mmol/L   Glucose, Bld 105 (H) 65 - 99 mg/dL   BUN 13 6 - 20 mg/dL   Creatinine, Ser 0.60 0.44 - 1.00 mg/dL   Calcium 9.0 8.9 - 10.3 mg/dL   GFR calc non Af Amer >60 >60 mL/min   GFR calc Af Amer >60 >60 mL/min    Comment: (NOTE) The eGFR has been calculated using the CKD EPI equation. This calculation has not been  validated in all clinical situations. eGFR's persistently <60 mL/min signify possible Chronic Kidney Disease.    Anion gap 8 5 - 15  CBC     Status: Abnormal   Collection Time: 10/12/15  5:35 AM  Result Value Ref Range   WBC 8.8 3.6 - 11.0 K/uL   RBC 4.24 3.80 - 5.20 MIL/uL   Hemoglobin 12.3 12.0 - 16.0 g/dL   HCT 36.2 35.0 - 47.0 %   MCV 85.3 80.0 - 100.0 fL   MCH 29.0 26.0 - 34.0 pg   MCHC 33.9 32.0 - 36.0 g/dL   RDW 18.5 (H) 11.5 - 14.5 %   Platelets 209 150 - 440 K/uL  Troponin I (q 6hr x 3)     Status: None  Collection Time: 10/12/15  5:35 AM  Result Value Ref Range   Troponin I <0.03 <0.03 ng/mL  Troponin I (q 6hr x 3)     Status: None   Collection Time: 10/12/15 10:56 AM  Result Value Ref Range   Troponin I <0.03 <0.03 ng/mL  T4, free     Status: None   Collection Time: 10/12/15 10:56 AM  Result Value Ref Range   Free T4 1.11 0.61 - 1.12 ng/dL    Comment: (NOTE) Biotin ingestion may interfere with free T4 tests. If the results are inconsistent with the TSH level, previous test results, or the clinical presentation, then consider biotin interference. If needed, order repeat testing after stopping biotin.   Potassium     Status: Abnormal   Collection Time: 10/13/15  2:02 PM  Result Value Ref Range   Potassium 3.4 (L) 3.5 - 5.1 mmol/L  Magnesium     Status: None   Collection Time: 10/13/15  2:02 PM  Result Value Ref Range   Magnesium 1.9 1.7 - 2.4 mg/dL    Radiology No results found.    Assessment/Plan  Adult failure to thrive Now on hospice  Dementia with behavioral disturbance Now on hospice services  Status post above knee amputation ([place]) Stable and healed  Atherosclerosis of native arteries of the extremities with ulceration ([place]) Left great toe ulceration. Has known severe peripheral vascular disease. In discussions with the caregiver, she is now in hospice services and she does not want any further interventions performed. This wound will  likely progress without therapy. We had a long discussion regarding the expected outcome and she is comfortable with no intervention at this time.  Essential hypertension blood pressure control important in reducing the progression of atherosclerotic disease. On appropriate oral medications.   Iliac artery aneurysm, bilateral ([facility]) See below  AAA (abdominal aortic aneurysm) without rupture ([place]) The patient has continued growth of her abdominal aortic aneurysm and bilateral iliac artery aneurysms. This is now basically 4 cm in the aorta and 2.94 cm in the right common iliac artery. A left common iliac arteries up to 2.44 cm. With continued growth of these aneurysms, or now approaching a size where we will have to determine whether or not this needs to be repaired. With the patient now on hospice services, the caregiver has decided not to pursue any repair. I think this is reasonable. The patient is unlikely to survive any major surgery at this point given her debilitated condition. This is a very difficult decision, but the caregiver seems comfortable with this. I will see her back as needed.    Leotis Pain, MD  12/30/2015 11:17 AM    This note was created with Dragon medical transcription system.  Any errors from dictation are purely unintentional

## 2016-02-25 ENCOUNTER — Emergency Department
Admission: EM | Admit: 2016-02-25 | Discharge: 2016-02-26 | Disposition: A | Discharge status: Home / Self Care | Attending: Emergency Medicine | Admitting: Emergency Medicine

## 2016-02-25 DIAGNOSIS — K6289 Other specified diseases of anus and rectum: Secondary | ICD-10-CM | POA: Insufficient documentation

## 2016-02-25 DIAGNOSIS — J449 Chronic obstructive pulmonary disease, unspecified: Secondary | ICD-10-CM | POA: Insufficient documentation

## 2016-02-25 DIAGNOSIS — Z79899 Other long term (current) drug therapy: Secondary | ICD-10-CM | POA: Diagnosis not present

## 2016-02-25 DIAGNOSIS — F17290 Nicotine dependence, other tobacco product, uncomplicated: Secondary | ICD-10-CM | POA: Insufficient documentation

## 2016-02-25 DIAGNOSIS — I10 Essential (primary) hypertension: Secondary | ICD-10-CM | POA: Diagnosis not present

## 2016-02-25 DIAGNOSIS — R197 Diarrhea, unspecified: Secondary | ICD-10-CM | POA: Diagnosis not present

## 2016-02-25 NOTE — Progress Notes (Addendum)
ED visit made. Patient is currently followed by [facility] at home with a diagnosis of peripheral vascular disease. She is a DNR code with an out of facility DNR in place in her home. She was brought to the ED via EMS from home for assessment of rectal pain. Patient seen lying on the ED stretcher, alert and oriented, forgetful at times. Sister [name] and niece at bedside. Patient denied pain during visit. Per patient report and report of her sister she had experienced some "pressure" and pain earlier in the day. Patient reports it is worse when she lies on her back, sister reports pain is there when she is "sitting on her tailbone". Writer discussed calling the hospice nurse to assess the problem in stead of calling EMS, educated on the availability of the nurse to come to the house at anytime. Also discussed using the hemorrhoid cream they have in the home. Attending physician Dr. Sharma CovertNorman in during visit and a digital rectal exam was performed. No bleeding noted, MD also checked for occult blood, Hemoccult negative. Dr. Sharma CovertNorman also stressed to the patient's sister the availability of the hospice nurse to come to the home instead of patient coming to the ED. Mrs. Toni ArthursFuller (patient's sister),  voiced understanding. Plan is for discharge back home, patient will require EMS transport as she is nonambulatory. Dr. Sharma CovertNorman and staff RN Rosey Batheresa aware. Hospice team updated. Thank you. Dayna BarkerKaren Robertson RN, BSN, [facility], [facility] L[facility] [phone] c

## 2016-02-25 NOTE — Discharge Instructions (Signed)
Please continue to use the Preparation H if it helps the pain. Continue to have a high fiber diet to prevent constipation. Return to the emergency department if you develop bleeding, lightheadedness or fainting, fever, severe abdominal pain, or any other symptoms concerning to you.

## 2016-02-25 NOTE — ED Notes (Addendum)
Pt is c/o rectal pain "2 inches inside" - family is concerned that pt has hemorrhoids or is constipated - pt last had BM on Monday  - denies rectal bleeding

## 2016-02-25 NOTE — ED Notes (Signed)
Informed by secretary that ems would not be able to transport pt home tonight - advised to call back in am for transport

## 2016-02-25 NOTE — ED Provider Notes (Signed)
[facility] Emergency Department Provider Note  ____________________________________________  Time seen: Approximately 4:35 PM  I have reviewed the triage vital signs and the nursing notes.   HISTORY  Chief Complaint Rectal Pain    HPI Gabrielle Lyons is a 83 y.o. female on hospice for PVD, history of hemorrhoids and chronic constipation, presenting for rectal discomfort. The patient is brought by her sister, who is her caregiver, and both describe that she has had an "internal pressure sensation" in the rectumthat she has intermittently, it is worse with laying down. She has been evaluated by her hospice nurse for this, and treated with Preparation H, which seems to help. She has not had any abdominal pain, rectal bleeding, fever. She does not have pain at this time. She is brought today by her sister, who was concerned about a "green brown streak" on the patient's diaper earlier today. The patient last had a bowel movement on Monday, and it is baseline for her to have one bowel movement weekly. Today she was oozing some stool.   Past Medical History:  Diagnosis Date  . Abdominal aneurysm (HCC)    also, "poor circulation" - legs  . COPD (chronic obstructive pulmonary disease) (HCC)   . GERD (gastroesophageal reflux disease)   . H/O blood clots    legs  . Hypercholesteremia   . Hypertension   . Mitral valve insufficiency   . Shortness of breath dyspnea    with exertion    Patient Active Problem List   Diagnosis Date Noted  . Status post above knee amputation (HCC) 12/30/2015  . Atherosclerosis of native arteries of the extremities with ulceration (HCC) 12/30/2015  . Essential hypertension 12/30/2015  . Iliac artery aneurysm, bilateral (HCC) 12/30/2015  . AAA (abdominal aortic aneurysm) without rupture (HCC) 12/30/2015  . Dementia with behavioral disturbance 10/15/2015  . Hypokalemia 10/15/2015  . Elevated troponin 10/15/2015  . Generalized weakness  10/15/2015  . Adult failure to thrive 10/15/2015  . Weakness   . Palliative care by specialist   . DNR (do not resuscitate)   . Adult failure to thrive syndrome   . Hypertensive urgency 10/11/2015    Past Surgical History:  Procedure Laterality Date  . BREAST LUMPECTOMY Left   . CATARACT EXTRACTION W/PHACO Left 11/18/2014   Procedure: CATARACT EXTRACTION PHACO AND INTRAOCULAR LENS PLACEMENT (IOC);  Surgeon: Sherald HessAnita Prakash Vin-Parikh, MD;  Location[facility];  Service: Ophthalmology;  Laterality: Left;  . CYST EXCISION     back  . FEMORAL ARTERY STENT Right   . OVARIAN CYST REMOVAL    . PERIPHERAL VASCULAR CATHETERIZATION Right 05/12/2015   Procedure: Lower Extremity Angiography;  Surgeon: Annice NeedyJason S Dew, MD;  Location: [facility];  Service: Cardiovascular;  Laterality: Right;  . PERIPHERAL VASCULAR CATHETERIZATION  05/12/2015   Procedure: Lower Extremity Intervention;  Surgeon: Annice NeedyJason S Dew, MD;  Location: [facility];  Service: Cardiovascular;;  . PERIPHERAL VASCULAR CATHETERIZATION Left 07/07/2015   Procedure: Lower Extremity Angiography;  Surgeon: Annice NeedyJason S Dew, MD;  Location: [facility];  Service: Cardiovascular;  Laterality: Left;  . PERIPHERAL VASCULAR CATHETERIZATION  07/07/2015   Procedure: Peripheral Vascular Intervention;  Surgeon: Annice NeedyJason S Dew, MD;  Location: [facility];  Service: Cardiovascular;;  . rt leg AKA    . TOE AMPUTATION Right   . TONSILLECTOMY      Current Outpatient Rx  . Order #: [id number] Class: Historical Med  . Order #: [id number] Class: Normal  .  Order #: [id number] Class: Normal  . Order #: [id number] Class: Historical Med  . Order #: [id number] Class: Normal  . Order #: [id number] Class: Historical Med  . Order #: [id number] Class: Normal  . Order #: [id number] Class: Normal  . Order #: [id number] Class: Print  . Order #: [id number] Class: Normal  . Order #: [id number] Class: Normal  . Order #: [id number] Class: Normal  . Order  #: [id number] Class: Normal  . Order #: [id number] Class: Print  . Order #: [id number] Class: Normal    Allergies Sulfa antibiotics  No family history on file.  Social History Social History  Substance Use Topics  . Smoking status: Former Smoker    Quit date: 10/23/2013  . Smokeless tobacco: Current User    Types: Snuff  . Alcohol use No    Review of Systems Constitutional: No fever/chills.No lightheadedness or syncope. Eyes: No visual changes. ENT: No congestion or rhinorrhea. Gastrointestinal: No abdominal pain.  No nausea, no vomiting.  No diarrhea.  Positive chronic unchanged constipation. Genitourinary: Negative for dysuria. Positive rectal pain. No rectal bleeding. Musculoskeletal: Negative for back pain. Skin: Negative for rash. Neurological: Negative for headaches. No focal numbness, tingling or weakness.   10-point ROS otherwise negative.  ____________________________________________   PHYSICAL EXAM:  VITAL SIGNS: ED Triage Vitals  Enc Vitals Group     BP 02/25/16 1330 (!) 149/80     Pulse Rate 02/25/16 1330 (!) 55     Resp 02/25/16 1330 16     Temp 02/25/16 1330 97.6 F (36.4 C)     Temp Source 02/25/16 1330 Oral     SpO2 02/25/16 1330 100 %     Weight --      Height --      Head Circumference --      Peak Flow --      Pain Score 02/25/16 1310 8     Pain Loc --      Pain Edu? --      Excl. in GC? --     Constitutional: Alert and oriented. Chronically ill appearing but in no acute distress. Answers questions appropriately. Eyes: Conjunctivae are normal.  EOMI. No scleral icterus. Head: Atraumatic. Nose: No congestion/rhinnorhea. Mouth/Throat: Mucous membranes are moist.  Neck: No stridor.  Supple.  No meningismus. Cardiovascular: Low rate, regular rhythm. No murmurs, rubs or gallops.  Respiratory: Normal respiratory effort with oxygen on.  No accessory muscle use or retractions. Lungs CTAB.  No wheezes, rales or ronchi. Gastrointestinal: Soft,  nontender and nondistended.  No guarding or rebound.  No peritoneal signs. Genitourinary: 1 nonthrombosed nonbleeding hemorrhoid at 12:00 externally, no significant palpable internal hemorrhoids. Positive green-brown losing stool without palpable stool ball. No pain with rectal examination. No fissures. Guaiac negative. Neurologic:  A&Ox3.  Speech is clear.  Face and smile are symmetric.  EOMI.  Moves all extremities well. Skin:  Skin is warm, dry and intact. No rash noted. Psychiatric: Mood and affect are normal. Speech and behavior are normal.  Normal judgement.  ____________________________________________   LABS (all labs ordered are listed, but only abnormal results are displayed)  Labs Reviewed - No data to display ____________________________________________  EKG  Not indicated ____________________________________________  RADIOLOGY  No results found.  ____________________________________________   PROCEDURES  Procedure(s) performed: None  Procedures  Critical Care performed: No ____________________________________________   INITIAL IMPRESSION / ASSESSMENT AND PLAN / ED COURSE  Pertinent labs & imaging results that were available during my care of the patient were reviewed by me and considered in my medical decision making (see chart for  details).  83 y.o. female with a history of hemorrhoids presenting with internal pressure sensation in the rectum and losing stool. Overall, the patient has reassuring vital signs without fever. Her rectal examination does show a small external hemorrhoid that is not flaring, thrombosed or infected. I do not feel any significant palpable internal hemorrhoids, nor does the patient have any evidence of bleeding. I have asked the patient to continue high-fiber diet, Preparation H as needed, and follow-up with her hospice nurse or her primary care physician. She understands return precautions. I do not see any evidence of an acute  intra-abdominal or intrarectal infection or injury. Discharge.  ____________________________________________  FINAL CLINICAL IMPRESSION(S) / ED DIAGNOSES  Final diagnoses:  Rectal pain  Liquid stool    Clinical Course       NEW MEDICATIONS STARTED DURING THIS VISIT:  New Prescriptions   No medications on file      Rockne Menghini, MD 02/25/16 1647

## 2016-02-25 NOTE — ED Notes (Signed)
Secretary notified to contact ems to transport pt home

## 2016-02-25 NOTE — ED Triage Notes (Signed)
Pt comes into the ED via EMS from home with c/o rectal pain/pressure, worse with sitting.. Pt is on 4L [place] continuous, and has DNR form with her chart.

## 2016-02-26 MED ORDER — LISINOPRIL 20 MG PO TABS
40.0000 mg | ORAL_TABLET | Freq: Once | ORAL | Status: AC
  Administered 2016-02-26: 40 mg via ORAL
  Filled 2016-02-26: qty 2

## 2016-02-26 MED ORDER — CLONIDINE HCL 0.1 MG PO TABS
0.3000 mg | ORAL_TABLET | Freq: Once | ORAL | Status: AC
  Administered 2016-02-26: 0.3 mg via ORAL
  Filled 2016-02-26: qty 3

## 2016-02-26 MED ORDER — HALOPERIDOL LACTATE 2 MG/ML PO CONC
0.5000 mg | Freq: Once | ORAL | Status: AC
  Administered 2016-02-26: 0.5 mg via ORAL
  Filled 2016-02-26: qty 0.3

## 2016-02-26 NOTE — ED Notes (Signed)
EMS arrived to transport patient for discharge.

## 2016-02-26 NOTE — Progress Notes (Signed)
Patient unable to discharge last night due to EMS being unavailable. Patient seen sleeping, per chart note review she did receive lisinopril and clonidine overnight and1 dose of PRN haldol 0.5 mg.  EMS has been called this morning for transport. Hospice team made aware. Thank you. Dayna BarkerKaren Robertson RN, BSN, [facility] [place], hospital Liaison [phone] c

## 2016-02-26 NOTE — ED Notes (Signed)
Attempted to call family member (sister) for medication information, due to increase in BP. Unable to reach. MD made aware.

## 2016-07-23 DEATH — deceased

## 2018-01-20 IMAGING — CR DG CHEST 2V
2 series · 2 of 2 positions shown · non-contrast
Comparison: None.

CLINICAL DATA: The last couple of days pt states she has been
extremely weak and fatigue. Pt has an ulcer on the tip of her great
toe on the left foot.

EXAM:
CHEST  2 VIEW

[chest lat]
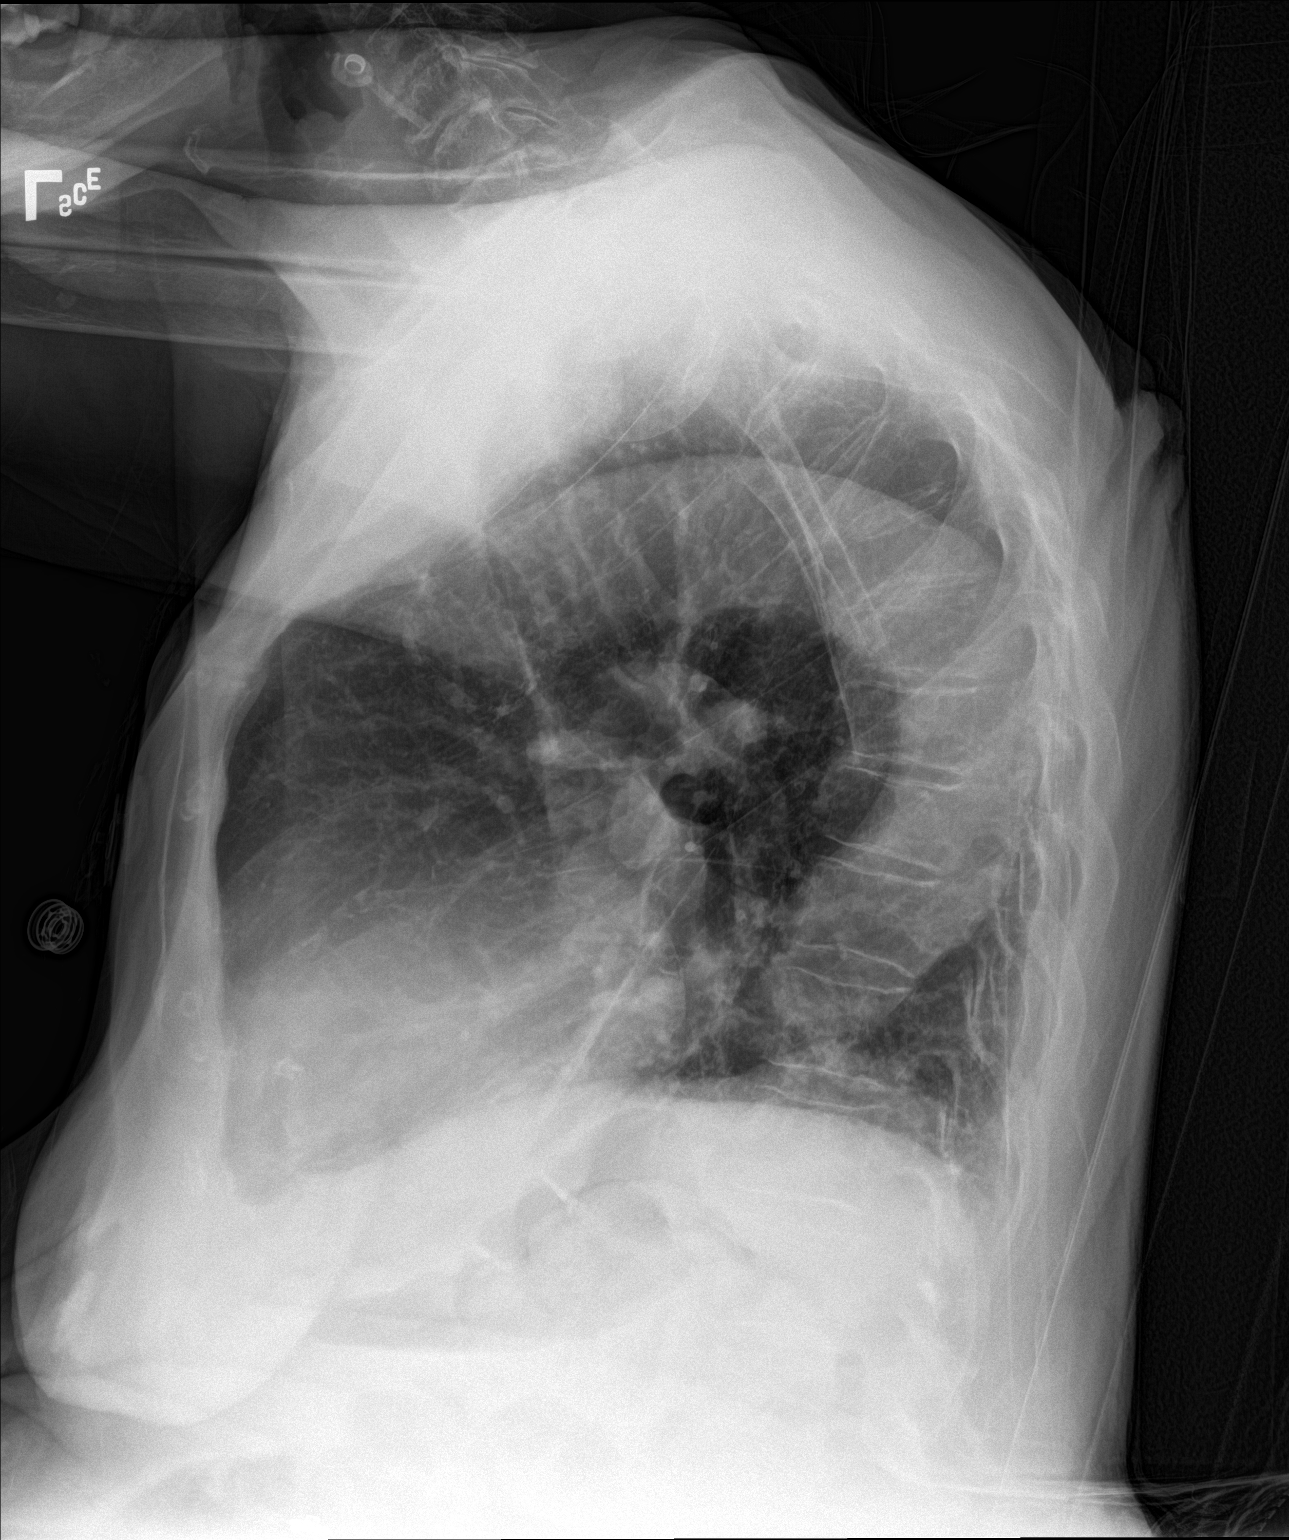

[chest ap]
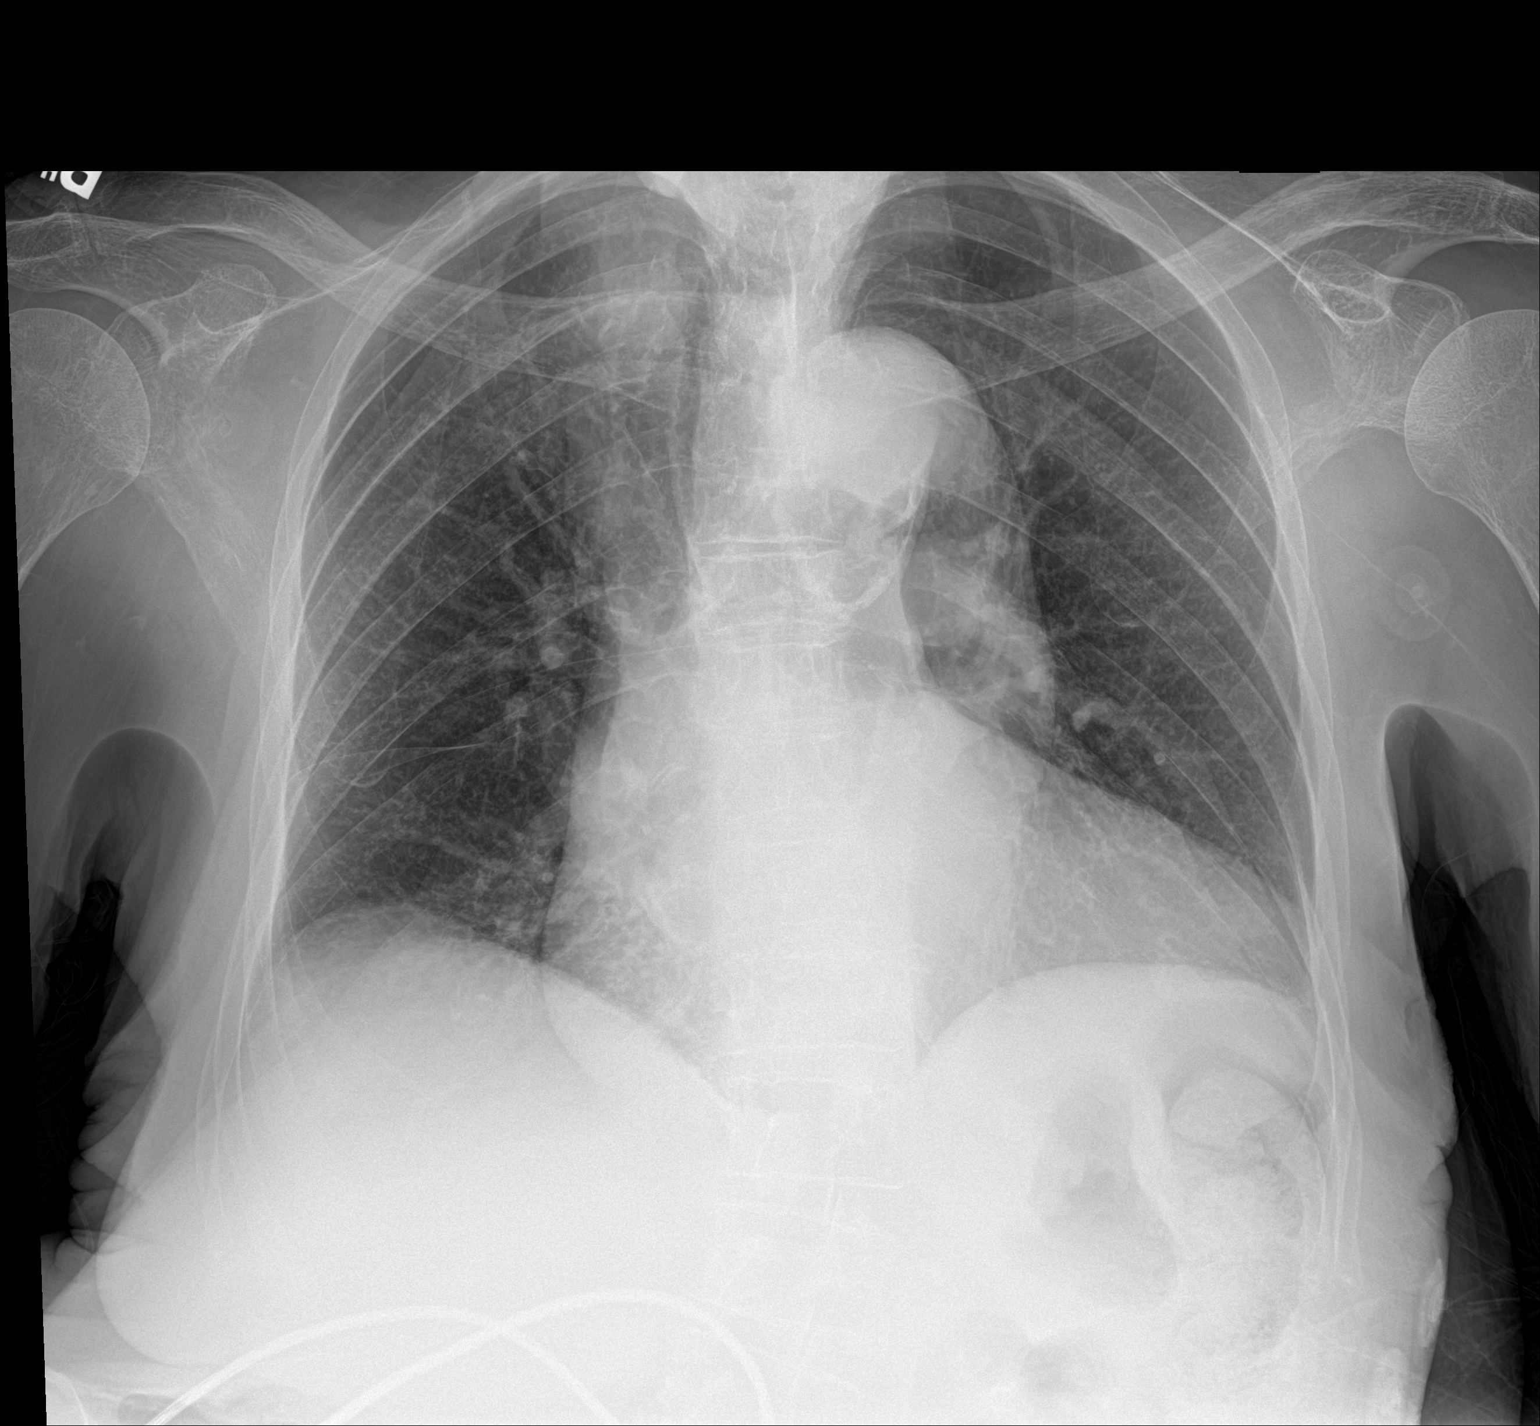

[2 of 2 positions shown; findings below may reference images not displayed]

FINDINGS: Hyperinflation. Patient rotated right on the frontal. Moderate
cardiomegaly. Markedly tortuous, atherosclerotic thoracic aorta. No
pleural effusion or pneumothorax. No congestive failure. Clear
lungs.
IMPRESSION: Cardiomegaly without congestive failure.

Markedly tortuous, atherosclerotic thoracic aorta.
